# Patient Record
Sex: Male | Born: 1950 | Race: White | Hispanic: No | Marital: Married | Smoking: Never smoker
Health system: Southern US, Community
[De-identification: ages and names within clinical notes are randomized; demographics above are authoritative.]

## PROBLEM LIST (undated history)

## (undated) DIAGNOSIS — I7121 Aneurysm of the ascending aorta, without rupture: Secondary | ICD-10-CM

## (undated) DIAGNOSIS — IMO0002 Reserved for concepts with insufficient information to code with codable children: Secondary | ICD-10-CM

## (undated) DIAGNOSIS — I712 Thoracic aortic aneurysm, without rupture: Secondary | ICD-10-CM

## (undated) DIAGNOSIS — J302 Other seasonal allergic rhinitis: Secondary | ICD-10-CM

## (undated) DIAGNOSIS — C73 Malignant neoplasm of thyroid gland: Secondary | ICD-10-CM

## (undated) DIAGNOSIS — C801 Malignant (primary) neoplasm, unspecified: Secondary | ICD-10-CM

## (undated) DIAGNOSIS — E039 Hypothyroidism, unspecified: Secondary | ICD-10-CM

## (undated) DIAGNOSIS — I10 Essential (primary) hypertension: Secondary | ICD-10-CM

## (undated) DIAGNOSIS — M1990 Unspecified osteoarthritis, unspecified site: Secondary | ICD-10-CM

## (undated) DIAGNOSIS — E785 Hyperlipidemia, unspecified: Secondary | ICD-10-CM

## (undated) DIAGNOSIS — I499 Cardiac arrhythmia, unspecified: Secondary | ICD-10-CM

## (undated) HISTORY — PX: PROSTATE SURGERY: SHX751

## (undated) HISTORY — PX: JOINT REPLACEMENT: SHX530

## (undated) HISTORY — DX: Malignant neoplasm of thyroid gland: C73

## (undated) HISTORY — DX: Thoracic aortic aneurysm, without rupture: I71.2

## (undated) HISTORY — PX: COLONOSCOPY: SHX174

## (undated) HISTORY — PX: KNEE ARTHROSCOPY: SUR90

## (undated) HISTORY — DX: Unspecified osteoarthritis, unspecified site: M19.90

## (undated) HISTORY — DX: Cardiac arrhythmia, unspecified: I49.9

## (undated) HISTORY — DX: Hyperlipidemia, unspecified: E78.5

## (undated) HISTORY — DX: Essential (primary) hypertension: I10

## (undated) HISTORY — DX: Aneurysm of the ascending aorta, without rupture: I71.21

---

## 1971-06-22 HISTORY — PX: PILONIDAL CYST EXCISION: SHX744

## 1995-06-22 HISTORY — PX: HEMORROIDECTOMY: SUR656

## 1996-06-21 HISTORY — PX: SHOULDER ARTHROSCOPY: SHX128

## 2002-06-21 HISTORY — PX: KNEE ARTHROSCOPY: SHX127

## 2003-06-22 HISTORY — PX: ABLATION OF DYSRHYTHMIC FOCUS: SHX254

## 2007-06-22 HISTORY — PX: CARDIOVERSION: SHX1299

## 2008-02-14 ENCOUNTER — Ambulatory Visit: Payer: Self-pay | Admitting: Cardiology

## 2008-02-20 ENCOUNTER — Encounter: Payer: Self-pay | Admitting: Cardiology

## 2008-02-20 ENCOUNTER — Ambulatory Visit: Payer: Self-pay

## 2008-03-07 ENCOUNTER — Ambulatory Visit: Payer: Self-pay | Admitting: Cardiology

## 2008-03-18 ENCOUNTER — Ambulatory Visit: Payer: Self-pay | Admitting: Surgery

## 2008-09-09 ENCOUNTER — Encounter: Admission: RE | Admit: 2008-09-09 | Discharge: 2008-09-09 | Payer: Self-pay | Admitting: Surgery

## 2008-09-12 ENCOUNTER — Ambulatory Visit: Payer: Self-pay | Admitting: Surgery

## 2008-10-30 ENCOUNTER — Encounter: Payer: Self-pay | Admitting: Cardiology

## 2008-11-07 ENCOUNTER — Encounter: Admission: RE | Admit: 2008-11-07 | Discharge: 2008-11-07 | Payer: Self-pay | Admitting: Surgery

## 2008-11-07 ENCOUNTER — Encounter (INDEPENDENT_AMBULATORY_CARE_PROVIDER_SITE_OTHER): Payer: Self-pay | Admitting: Interventional Radiology

## 2008-11-07 ENCOUNTER — Other Ambulatory Visit: Admission: RE | Admit: 2008-11-07 | Discharge: 2008-11-07 | Payer: Self-pay | Admitting: Interventional Radiology

## 2008-12-27 ENCOUNTER — Encounter (INDEPENDENT_AMBULATORY_CARE_PROVIDER_SITE_OTHER): Payer: Self-pay | Admitting: Surgery

## 2008-12-27 ENCOUNTER — Ambulatory Visit (HOSPITAL_COMMUNITY): Admission: RE | Admit: 2008-12-27 | Discharge: 2008-12-28 | Payer: Self-pay | Admitting: Surgery

## 2008-12-27 HISTORY — PX: THYROIDECTOMY: SHX17

## 2009-01-28 ENCOUNTER — Encounter: Payer: Self-pay | Admitting: Cardiology

## 2009-01-31 ENCOUNTER — Ambulatory Visit: Payer: Self-pay | Admitting: Family Medicine

## 2009-01-31 DIAGNOSIS — L259 Unspecified contact dermatitis, unspecified cause: Secondary | ICD-10-CM | POA: Insufficient documentation

## 2009-01-31 DIAGNOSIS — I10 Essential (primary) hypertension: Secondary | ICD-10-CM | POA: Insufficient documentation

## 2009-01-31 DIAGNOSIS — E785 Hyperlipidemia, unspecified: Secondary | ICD-10-CM | POA: Insufficient documentation

## 2009-02-07 ENCOUNTER — Encounter (HOSPITAL_COMMUNITY): Admission: RE | Admit: 2009-02-07 | Discharge: 2009-04-25 | Payer: Self-pay | Admitting: Endocrinology

## 2009-03-25 ENCOUNTER — Encounter: Payer: Self-pay | Admitting: Cardiology

## 2009-06-20 ENCOUNTER — Ambulatory Visit: Payer: Self-pay | Admitting: Family Medicine

## 2009-06-20 DIAGNOSIS — J069 Acute upper respiratory infection, unspecified: Secondary | ICD-10-CM | POA: Insufficient documentation

## 2009-09-01 ENCOUNTER — Encounter: Admission: RE | Admit: 2009-09-01 | Discharge: 2009-09-01 | Payer: Self-pay | Admitting: Surgery

## 2009-09-02 ENCOUNTER — Ambulatory Visit: Payer: Self-pay | Admitting: Surgery

## 2010-02-12 ENCOUNTER — Ambulatory Visit: Payer: Self-pay | Admitting: Diagnostic Radiology

## 2010-02-12 ENCOUNTER — Emergency Department (HOSPITAL_BASED_OUTPATIENT_CLINIC_OR_DEPARTMENT_OTHER): Admission: EM | Admit: 2010-02-12 | Discharge: 2010-02-12 | Payer: Self-pay | Admitting: Emergency Medicine

## 2010-02-12 ENCOUNTER — Encounter: Payer: Self-pay | Admitting: Family Medicine

## 2010-07-12 ENCOUNTER — Encounter: Payer: Self-pay | Admitting: Endocrinology

## 2010-07-13 ENCOUNTER — Encounter: Payer: Self-pay | Admitting: Endocrinology

## 2010-07-23 NOTE — Assessment & Plan Note (Signed)
Summary: CHANGES IN LEFT SIDE OF FACE,NUMBNESS,LOSS OF NORMAL FACIAL M...    Current Allergies: No known allergies  Assessment PATIENT PROCEEDED TO ER  The patient and/or caregiver has been counseled thoroughly with regard to medications prescribed including dosage, schedule, interactions, rationale for use, and possible side effects and they verbalize understanding.  Diagnoses and expected course of recovery discussed and will return if not improved as expected or if the condition worsens. Patient and/or caregiver verbalized understanding.

## 2010-08-13 ENCOUNTER — Other Ambulatory Visit: Payer: Self-pay | Admitting: Surgery

## 2010-08-13 DIAGNOSIS — I712 Thoracic aortic aneurysm, without rupture: Secondary | ICD-10-CM

## 2010-08-13 DIAGNOSIS — I7121 Aneurysm of the ascending aorta, without rupture: Secondary | ICD-10-CM

## 2010-08-17 ENCOUNTER — Other Ambulatory Visit (HOSPITAL_COMMUNITY): Payer: Self-pay | Admitting: Endocrinology

## 2010-08-17 DIAGNOSIS — C73 Malignant neoplasm of thyroid gland: Secondary | ICD-10-CM

## 2010-08-24 ENCOUNTER — Ambulatory Visit (HOSPITAL_COMMUNITY): Payer: Self-pay

## 2010-08-25 ENCOUNTER — Ambulatory Visit (HOSPITAL_COMMUNITY): Payer: Self-pay

## 2010-08-26 ENCOUNTER — Ambulatory Visit (HOSPITAL_COMMUNITY): Payer: Self-pay

## 2010-08-28 ENCOUNTER — Encounter (HOSPITAL_COMMUNITY): Payer: Self-pay

## 2010-08-31 ENCOUNTER — Encounter (HOSPITAL_COMMUNITY)
Admission: RE | Admit: 2010-08-31 | Discharge: 2010-08-31 | Disposition: A | Discharge status: Home / Self Care | Payer: 59 | Source: Ambulatory Visit | Attending: Endocrinology | Admitting: Endocrinology

## 2010-08-31 DIAGNOSIS — C73 Malignant neoplasm of thyroid gland: Secondary | ICD-10-CM

## 2010-09-01 ENCOUNTER — Ambulatory Visit (HOSPITAL_COMMUNITY)
Admission: RE | Admit: 2010-09-01 | Discharge: 2010-09-01 | Disposition: A | Discharge status: Home / Self Care | Payer: 59 | Source: Ambulatory Visit | Attending: Endocrinology | Admitting: Endocrinology

## 2010-09-02 ENCOUNTER — Ambulatory Visit (HOSPITAL_COMMUNITY)
Admission: RE | Admit: 2010-09-02 | Discharge: 2010-09-02 | Disposition: A | Discharge status: Home / Self Care | Payer: 59 | Source: Ambulatory Visit | Attending: Endocrinology | Admitting: Endocrinology

## 2010-09-04 ENCOUNTER — Encounter (HOSPITAL_COMMUNITY): Payer: Self-pay

## 2010-09-04 ENCOUNTER — Ambulatory Visit (HOSPITAL_COMMUNITY)
Admission: RE | Admit: 2010-09-04 | Discharge: 2010-09-04 | Disposition: A | Discharge status: Home / Self Care | Payer: 59 | Source: Ambulatory Visit | Attending: Endocrinology | Admitting: Endocrinology

## 2010-09-04 DIAGNOSIS — C73 Malignant neoplasm of thyroid gland: Secondary | ICD-10-CM | POA: Insufficient documentation

## 2010-09-04 HISTORY — DX: Malignant (primary) neoplasm, unspecified: C80.1

## 2010-09-04 MED ORDER — SODIUM IODIDE I 131 CAPSULE: 4.1000 | Freq: Once | INTRAVENOUS | Status: AC | PRN

## 2010-09-08 ENCOUNTER — Encounter: Payer: Self-pay | Admitting: Surgery

## 2010-09-08 ENCOUNTER — Other Ambulatory Visit: Payer: Self-pay

## 2010-09-15 ENCOUNTER — Ambulatory Visit
Admission: RE | Admit: 2010-09-15 | Discharge: 2010-09-15 | Disposition: A | Discharge status: Home / Self Care | Payer: 59 | Source: Ambulatory Visit | Attending: Surgery | Admitting: Surgery

## 2010-09-15 ENCOUNTER — Encounter (INDEPENDENT_AMBULATORY_CARE_PROVIDER_SITE_OTHER): Payer: 59 | Admitting: Surgery

## 2010-09-15 DIAGNOSIS — I712 Thoracic aortic aneurysm, without rupture, unspecified: Secondary | ICD-10-CM

## 2010-09-15 DIAGNOSIS — I7121 Aneurysm of the ascending aorta, without rupture: Secondary | ICD-10-CM

## 2010-09-15 MED ORDER — GADOBENATE DIMEGLUMINE 529 MG/ML IV SOLN
15.0000 mL | Freq: Once | INTRAVENOUS | Status: AC | PRN
  Administered 2010-09-15: 15 mL via INTRAVENOUS

## 2010-09-15 NOTE — Assessment & Plan Note (Signed)
OFFICE VISIT  XAIVER, [name] Ayala DOB:  May 24, 1951                                        September 15, 2010 CHART #:  [id number]  The patient returned to my office today for followup of an ascending aortic aneurysm.  I last saw him on September 02, 2009, at which time, MR angiogram showed a stable 4.7-cm fusiform ascending aortic aneurysm.  He has been feeling well over the past year and said he has remained physically active without chest pain or shortness of breath.  On physical examination today his blood pressure is 161/82, pulse is 90 and regular, respiratory rate is 16 and unlabored.  Oxygen saturation on room air is 95%.  He looks well.  Cardiac exam shows regular rate and rhythm with normal heart sounds.  There is no murmur, rub, or gallop. His lungs are clear.  MR angiogram of the chest done today shows a stable 4.7-cm fusiform ascending aortic aneurysm.  This is not changed since a scan 1 year ago on September 01, 2009.  The distal ascending aorta tapers down to about 3.7 cm.  The distal arch is 3 cm.  There is no sign of aortic dissection.  IMPRESSION:  The patient has a stable 4.7-cm fusiform ascending aortic aneurysm with no evidence of aortic insufficiency on exam.  I recommended continued followup with a repeat MR angiogram to 1 year.  I stressed the importance of good blood pressure control.  He said that his blood pressure usually runs in the 120s at home and he checks it frequently, but thinks that his blood pressure probably increases when he comes to the doctor's office.  His systolic blood pressure was in the 160 range today and was 162/84, when I saw him last year.  He will continue to follow up with his primary physician, Dr. Olivia Canter, as well as his cardiologist, Dr. Valera Castle.  He has been followed by Dr. Darnell Level and Dr. 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  Evelene Croon, M.D. Electronically Signed  BB/MEDQ  D:  09/15/2010  T:  09/15/2010  Job:  [id number]  cc:   Dr. Anise Salvo. Daleen Squibb, MD, [facility]. Evlyn Kanner, M.D.

## 2010-09-27 LAB — URINALYSIS, ROUTINE W REFLEX MICROSCOPIC
Hgb urine dipstick: NEGATIVE
Ketones, ur: NEGATIVE mg/dL
Nitrite: NEGATIVE
Protein, ur: NEGATIVE mg/dL
Specific Gravity, Urine: 1.024 (ref 1.005–1.030)
Urobilinogen, UA: 0.2 mg/dL (ref 0.0–1.0)

## 2010-09-27 LAB — COMPREHENSIVE METABOLIC PANEL
ALT: 30 U/L (ref 0–53)
Albumin: 4.4 g/dL (ref 3.5–5.2)
BUN: 26 mg/dL — ABNORMAL HIGH (ref 6–23)
Calcium: 9.5 mg/dL (ref 8.4–10.5)
Chloride: 105 mEq/L (ref 96–112)
Creatinine, Ser: 0.99 mg/dL (ref 0.4–1.5)

## 2010-09-27 LAB — DIFFERENTIAL
Basophils Absolute: 0 10*3/uL (ref 0.0–0.1)
Basophils Relative: 1 % (ref 0–1)
Eosinophils Absolute: 0.1 10*3/uL (ref 0.0–0.7)
Lymphocytes Relative: 31 % (ref 12–46)
Lymphs Abs: 1.1 10*3/uL (ref 0.7–4.0)
Monocytes Relative: 10 % (ref 3–12)

## 2010-09-27 LAB — CALCIUM: Calcium: 9.2 mg/dL (ref 8.4–10.5)

## 2010-09-27 LAB — PROTIME-INR
INR: 0.9 (ref 0.00–1.49)
Prothrombin Time: 12.1 seconds (ref 11.6–15.2)

## 2010-09-27 LAB — CBC
HCT: 46.3 % (ref 39.0–52.0)
RBC: 4.93 MIL/uL (ref 4.22–5.81)
RDW: 12.6 % (ref 11.5–15.5)
WBC: 3.6 10*3/uL — ABNORMAL LOW (ref 4.0–10.5)

## 2010-11-03 NOTE — Assessment & Plan Note (Signed)
OFFICE VISIT   [name] Ayala, [name] Ayala  DOB:  [birth year]                                        September 12, 2008  CHART #:  [id number]   The patient returns today for followup of an ascending aortic aneurysm.  I last saw him on March 18, 2008, at which time CT scan dated  March 07, 2008, showed an ascending aortic aneurysm measuring 4.8 cm  in the mid ascending aorta.  This tapered to a diameter about 4 cm in  the distal ascending aorta and the distal arch measured about 3 cm.  The  descending thoracic aorta was 2.8 cm at the level of the diaphragm.  Over the past year, the patient underwent cardioversion for atrial  fibrillation or atrial flutter.  He said he is maintaining sinus rhythm  on Cardizem.  He has had no chest pain or pressure.  He denies any  shortness of breath.  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   PHYSICAL EXAMINATION:  VITAL SIGNS:  Today his blood pressure is  elevated at 175/93, his pulse is 75 and regular.  Respiratory rate is  18, unlabored.  Oxygen saturation on room air is 97%.  GENERAL:  He looks well.  CARDIAC:  Regular rate and rhythm with normal heart sounds.  There is no  murmur.  LUNGS:  Clear.  EXTREMITIES:  No peripheral edema.   MR angiogram of the chest today shows no change in the size of his  ascending aortic aneurysm at 4.8 cm in the mid ascending aorta.  In the  MR angiogram report, the radiologist also commented on a right thyroid  nodule, which they felt showed up better on the CT scan in September  2009.  In that CT scan, they measured it at 2.7 cm, although they did  not comment on that nodule in that original CT scan report.   His medications are Cardizem 360 mg daily, Crestor 10 mg daily, fish oil  1000 mg daily, vitamin C 1000 mg daily, and aspirin 81 mg daily.   IMPRESSION:  The patient has a 4.8-cm ascending aortic aneurysm, which  is unchanged from a CT scan on  March 07, 2008.  I have recommended  continuing to follow this aneurysm with a repeat MR angiogram in 1 year.  I have stressed the importance of good blood pressure control with him  and he said that his blood pressure is usually running much less than it  is today and is usually in the 130 systolic.  He said that he was  somewhat nervous coming to the office today.  I have recommended that he  obtain his own blood pressure cuff to keep a close eye on his blood  pressure and avoid heavy weight lifting, which may suddenly increase his  blood pressure.  He also has a right thyroid nodule that was seen on CT  scan in September, but not commented on at that time.  We will refer him  to General Surgery for further evaluation of this nodule.  He will  continue to follow up with Dr. Daleen Squibb for his Cardiology care and Dr.  Sampson Goon for his electrophysiology.   Evelene Croon, M.D.  Electronically Signed   BB/MEDQ  D:  09/12/2008  T:  09/13/2008  Job:  [id number]  cc:   Jesse Sans. Daleen Squibb, MD, [facility]  Olivia Canter, MD  Dr. Sampson Goon

## 2010-11-03 NOTE — Assessment & Plan Note (Signed)
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   The last time I saw him was in 2005.   He is presently 60 years of age and in incredibly good athletic shape.  Since Friday, he has felt lightheaded, dizzy, and short of breath.  He  thinks he is back in a fast rhythm.   His past medical history includes,  1. History of tachypalpitations.  He has had a previous ablation for      SVT by Dr. Sampson Goon.  2. Hyperlipidemia, followed by Dr. Olivia Canter in [place].  3. Mild aortic root dilatation.  4. Mild aortic valve sclerosis.  5. Mitral valve prolapse with mild mitral regurgitation by last 2-D      echocardiogram in 2005.   He is currently taking;  1. Crestor 10 mg a day.  2. Enteric-coated aspirin 81 mg a day.   He has no known drug allergies.  He has been on diltiazem in the past.   He has no dye allergies.  He has no drug allergies.  He does not smoke  or drink, but he does have 1-2 alcoholic beverages per week.  He has 2  glasses of ice tea a day.  He works on a regular basis.   SURGICAL HISTORY:  He has had both knees operated on in 1972, 1982, and  then again in 2003.  He has had a right rotator cuff repair in 1998.  He  had the ablation in 2003 with Dr. Sampson Goon.   FAMILY HISTORY:  Negative other than for high blood pressure.   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  He enjoys Brewing technologist games.  He works out a lot.  He is married and has 2 children.  He lives in  [place].   REVIEW OF SYSTEMS:  Other than in the HPI is negative.  He denies  orthopnea, PND, syncope, or presyncope.   PHYSICAL EXAMINATION:  He is in a remarkably good  shape, very pleasant,  in no acute distress.  Blood pressure 130/90 and his heart rate is 117  on his EKG, but what it looks like on the surface ECG is atrial flutter.  During his exam, it varied from 60-150.  HEENT, normocephalic and  atraumatic.  PERRLA.  Extraocular movements are intact.  Sclerae are  clear.  Facial symmetry is normal.  Carotid upstrokes are equal  bilaterally without bruits.  No JVD.  Thyroid is not enlarged.  Trachea  is midline.  Lungs are clear.  Heart reveals a rapid rate and rhythm  with some irregularity.  Abdominal exam is soft, good bowel sounds, no  midline bruit, and no hepatomegaly.  Extremities with no cyanosis,  clubbing, or edema.  Pulses are intact.  Neuro exam is  intact.   ASSESSMENT:  1. Recurrent supraventricular tachycardia, probably atrial flutter,      with a variable rate and block.  2. Shortness of breath, dizziness, and reduced exercise tolerance      secondary to recurrent supraventricular tachycardia, probably      atrial flutter, with a variable rate and block.   RECOMMENDATIONS:  I had a long talk with [name] Ayala.  He has an  appointment with Dr. Sampson Goon in a month, and that is too long.  I  will send a copy of this note and an EKG, and he will call Dr.  Jarrett Ables office to be seen hopefully by next week.  I placed him on  low-dose diltiazem XR 180 mg a day.     [name] C. Daleen Squibb, MD, [facility]  Electronically Signed    TCW/MedQ  DD: 02/14/2008  DT: 02/15/2008  Job #: [id number]   cc:   Clydie Braun, MD

## 2010-11-03 NOTE — Consult Note (Signed)
NEW PATIENT CONSULTATION   [name] Ayala, [name] Ayala  DOB:  1951-05-26                                        March 18, 2008  CHART #:  [id number]   REASON FOR CONSULTATION:  Ascending aortic aneurysm.   CLINICAL HISTORY:  I was asked by Dr. Daleen Squibb to evaluate the patient for  ascending aortic aneurysm.  He is a 60 year old muscular and very  athletic gentleman with a history of hypertension and hyperlipidemia,  who has a history of atrial arrhythmias, having undergone ablation for  supraventricular tachycardia in the past by Dr. Sampson Goon at [facility].  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  He was seen by Dr. Daleen Squibb and felt to be in  atrial flutter by electrocardiogram.  His heart rate was 117.  He was  started on diltiazem XR 180 mg daily.  He underwent an echocardiogram on  February 20, 2008, which showed overall normal left ventricular systolic  function and normal left ventricular size with ejection fraction of 60%.  There is mild aortic valve sclerosis.  The aortic root was at the upper  limits of normal size with a diameter of 38 mm.  The ascending aorta was  enlarged at 46 mm.  There is mild thickening in the mitral valve with no  mitral valve prolapse and trivial regurgitation.  The patient was  subsequently scheduled for a CT angiogram of the chest performed on  March 07, 2008, which showed an ascending aortic aneurysm measuring  4.8 cm in the mid ascending aorta.  This tapered to a diameter of 4 cm  in the distal ascending aorta and the distal arch measured about 3 cm in  diameter.  The descending thoracic aorta measures about 2.8 cm at the  level of diaphragm.   REVIEW OF SYSTEMS:  His review of systems is as follows:  General:  He  denies any fever or chills.  He has lost some weight over the past year  which he attributes to trying  to cut down on his weightlifting.  He had  fatigue associated with his tachycardia.  Eyes:  Negative.  ENT:  Negative.  Endocrine:  Denies diabetes and hypothyroidism.  Cardiovascular:  He denies any chest pain or pressure.  He did have some  shortness of breath with exertion when these tachyarrhythmias started.  He denies PND or orthopnea.  He has had no peripheral edema.  Respiratory:  Denies cough and sputum production.  GI:  He has had no  nausea or vomiting.  Denies melena and bright red blood per rectum.  GU:  He denies dysuria and hematuria.  Vascular:  Denies claudication or  phlebitis.  Neurological:  He has no focal weakness or numbness.  Denies  dizziness and syncope.  He has never had TIA or stroke.  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  Psychiatric:  Negative.  Hematologic:  Negative.   PAST MEDICAL HISTORY:  His past medical history significant for  hypertension.  He has been reluctant to take antihypertensive in the  past.  He has a history of hyperlipidemia.  He has history of tachy-  palpitations and supraventricular tachycardia, as mentioned above  followed by Dr. Sampson Goon at  [facility].  He is  status post ablation for this.  He is status post surgery on both knees  and status post right rotator cuff repair in the past.   FAMILY HISTORY:  Negative for cardiovascular and aneurysmal disease.   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  He has two grown  children.  He has never smoked and denies regular alcohol use.   ALLERGIES:  None.   MEDICATIONS:  Crestor 10 mg daily, enteric-coated aspirin 81 mg daily,  Coumadin daily, Cardizem 180 mg two daily, fish oil 1000 mg daily,  vitamin C 1000 mg daily.   PHYSICAL EXAMINATION:  VITAL SIGNS:  Blood pressure is 149/91.  His  pulse is 120 and regular.  Respiratory rate is 18, unlabored.  Oxygen  saturation is 97%.  GENERAL:  He is a  well-developed, muscular white male in no distress.  HEENT:  Normocephalic and atraumatic.  Pupils are equal and reactive to  light and accommodation.  Extraocular muscles are intact.  His throat is  clear.  NECK:  Normal carotid pulses bilaterally.  There are no bruits.  There  is no adenopathy or thyromegaly.  CARDIAC:  Regular rate and rhythm with tachycardia present.  There is no  murmur, rub, or gallop.  LUNGS:  Clear.  ABDOMEN:  Active bowel sounds.  Abdomen is soft and nontender.  There  are no palpable masses or organomegaly.  EXTREMITIES:  No peripheral edema.  There are scars over both knees.  Pedal pulses are palpable bilaterally.  SKIN:  Warm and dry.  NEUROLOGIC:  Alert and oriented x3.  Motor and sensory exam is grossly  normal.   IMPRESSION:  The patient has a 4.8 cm ascending aortic aneurysm of  unclear duration.  The size of his descending thoracic aorta is about  2.8 to 3 cm.  He has no significant aortic valve disease by  echocardiogram.  I discussed the indications for surgical repair of his  aortic aneurysm with him.  I have told him that in general I would  recommend operative repair if he initially presented with an aneurysm of  5.5 cm or if we see a progressive enlargement of his aneurysm on  successive CT scans.  At this time, I would recommend doing a MR  angiogram in 6 months to reassess this for any enlargement.  I have also  stressed the importance of good blood pressure control.  I think he  should be on a beta-blocker and possibly an ACE inhibitor as tolerated.  I will leave that decision up to Dr. Daleen Squibb since he is currently on  Cardizem for control of his atrial arrhythmias.  He is currently on  Coumadin for his atrial flutter and is going to follow up with Dr.  Sampson Goon for consideration of electrophysiology study and possible  ablation.  I have also discussed the importance of avoiding extremely  strenuous activities such as heavy weight lifting  which may cause high  blood pressure spikes that could increase his risk for aortic dissection  or rupture.  I think the risk of these complications under baseline  conditions at the current size of the aorta is very low, but he said  that he is frequently lifting heavy weights which would certainly  increase his risk.  He seems to understand this and said that he is  planning on trying to decrease his weightlifting to avoid that type of  strenuous exertion.  I will  plan to see him back in 6 months with an MR  angiogram to reassess the aneurysm.   Evelene Croon, M.D.  Electronically Signed   BB/MEDQ  D:  03/18/2008  T:  03/19/2008  Job:  [id number]   cc:   Thomas C. Daleen Squibb, MD, [facility]  Mick Sell, MD

## 2010-11-03 NOTE — Assessment & Plan Note (Signed)
OFFICE VISIT   [name] Ayala, [name] Ayala  DOB:  [birth year]                                        September 02, 2009  CHART #:  [id number]   HISTORY:  The patient returned today for followup of his ascending  aortic aneurysm.  I last saw him on September 12, 2008, at which time his  4.8-cm ascending aortic aneurysm was unchanged from previous CT scan in  September 2009.  The scan did show a right thyroid nodule and he was  referred to General Surgery.  Dr. 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  He subsequently  underwent radioactive iodine treatment and has subsequently been  followed up by Dr. Gerrit Friends and Dr. Adrian Prince and is doing well from  that.  The patient denies any chest pressure or pain.  He has had no  back pain.  He denies any shortness of breath.  He has been remaining  quite active, but is staying away from weightlifting.   PHYSICAL EXAMINATION:  Today, his blood pressure 162/84, pulse is 72 and  regular, respiratory rate is 18 and unlabored.  Oxygen saturation on  room air is 98%.  He looks well.  Cardiac exam shows regular rate and  rhythm with normal heart sounds.  There is no murmur.  His lungs are  clear.   MR angiogram of the chest done on September 01, 2009 shows a stable 4.7-cm  ascending aortic aneurysm compared to previous scan of September 09, 2008.  The distal ascending aorta measures 3.7 cm and the distal arch 3 cm.  There is no evidence of aortic dissection.   MEDICATIONS:  1. Diltiazem 360 mg daily.  2. Crestor 10 mg daily.  3. Aspirin 81 mg daily.  4. Losartan 25 mg daily.  5. Fish oil 1000 mg daily.  6. Vitamin C 1000 mg daily.   IMPRESSION:  The patient has a stable 4.7-cm ascending aortic aneurysm.  I would recommend continued followup.  This has been stable over the  past 2 years.  We will plan to do a MR angiogram in 1  year.  I stressed  the importance of good blood pressure control.  He said that he checks  his blood pressure frequently at home and it is usually in the 120  range.  I will plan to see him back in 1 year.   Evelene Croon, M.D.  Electronically Signed   BB/MEDQ  D:  09/02/2009  T:  09/03/2009  Job:  [id number]   cc:   Thomas C. Daleen Squibb, MD, [facility]  Dr. Olivia Canter  Velora Heckler, MD  Tera Mater. Evlyn Kanner, M.D.

## 2010-11-03 NOTE — Op Note (Signed)
NAME:  [name] Ayala, [name] Ayala NO.:  [id number]   MEDICAL RECORD NO.:  [id number]          PATIENT TYPE:  OIB   LOCATION:  1523                         FACILITY:  [facility]   PHYSICIAN:  Velora Heckler, MD      DATE OF BIRTH:  March 27, 1951   DATE OF PROCEDURE:  12/27/2008  DATE OF DISCHARGE:                               OPERATIVE REPORT   PREOPERATIVE DIAGNOSIS:  Right thyroid nodule with cytologic atypia.   POSTOPERATIVE DIAGNOSIS:  Right thyroid nodule with cytologic atypia.   PROCEDURE:  Total thyroidectomy.   SURGEON:  Velora Heckler, M.D., FACS   ANESTHESIA:  General per Dr. Brayton Caves.   ESTIMATED BLOOD LOSS:  Minimal.   PREPARATION:  ChloraPrep.   COMPLICATIONS:  None.   INDICATIONS:  The patient is a 60 year old white male from [place],  [place].  The patient had an incidental finding of a thyroid  nodule found on CT scan of the chest.  He was referred and underwent  fine-needle aspiration and biopsy.  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  Decision was made to proceed  with excision.   BODY OF REPORT:  Procedure was done in OR #11 at the [facility].  The patient was brought to the operating room and  placed in supine position on the operating room table.  Following  administration of general anesthesia, the patient was positioned and  then prepped and draped in the usual strict aseptic fashion.  After  ascertaining that an adequate level of anesthesia had been achieved, a  Kocher incision was made a #15 blade.  Dissection was carried through  subcutaneous tissues and platysma.  Hemostasis was obtained with  electrocautery.  Skin flaps were elevated cephalad and caudad from the  thyroid notch to the sternal notch.  A Mahorner self-retaining retractor  was placed for exposure.  Strap muscles were incised in the midline.  Dissection was begun on the right side.  Strap muscles were  reflected to  the right demonstrating the enlarged right thyroid lobe.  Essentially,  the entire lobe was replaced by a large mass measuring at least 3 to 3.5-  cm in dimension.  This was gently dissected out.  Venous tributaries  were divided between medium hemoclips with the Harmonic scalpel.  Gland  was mobilized anteriorly.  Superior pole vessels were divided between  medium Ligaclips with the Harmonic scalpel.  Parathyroid tissue was  identified and preserved.  Gland was rolled further anteriorly, and the  branches of the inferior thyroid artery were divided between small  Ligaclips.  Recurrent laryngeal nerve was identified and preserved along  its course.  Ligament of Allyson Sabal was transected with electrocautery, and  the gland was mobilized up and onto the anterior trachea.  There was no  significant pyramidal lobe.  Isthmus was mobilized across the midline  with electrocautery.  Dry pack was placed in the right neck.   Next, we turned our attention to the left thyroid lobe.  Again, strap  muscles were reflected laterally.  Left lobe was  small but does contain  at least two 1-cm firm discrete nodules.  Therefore, we proceeded with  total thyroidectomy.  The middle thyroid vein was divided between medium  Ligaclips with the Harmonic scalpel.  Inferior venous tributaries were  divided between medium Ligaclips with the Harmonic scalpel.  Superior  pole vessels were divided between medium Ligaclips with the Harmonic  scalpel.  Gland was rolled anteriorly.  Parathyroid tissue was  identified and preserved on its vascular pedicle.  Branches of the  inferior thyroid artery were divided between small Ligaclips.  Recurrent  laryngeal nerve was identified and preserved.  Ligament of Allyson Sabal was  transected with electrocautery, and the gland was excised off of the  anterior trachea using the Harmonic scalpel.  Specimen was submitted to  pathology with a suture marking the right superior pole.   Neck was  irrigated with warm saline.  Good hemostasis was noted.  Surgicel was  placed in the operative field bilaterally.  Strap muscles were  reapproximated in the midline with interrupted 3-0 Vicryl sutures.  Platysma was closed with interrupted 3-0 Vicryl sutures.  Skin was  closed with a running 4-0 Monocryl subcuticular suture.  Wound was  washed and dried, and Benzoin and Steri-Strips were applied.  Sterile  dressings were applied.  The patient was awakened from anesthesia and  brought to the recovery room in stable condition.  The patient tolerated  the procedure well.      Velora Heckler, MD  Electronically Signed     TMG/MEDQ  D:  12/27/2008  T:  12/27/2008  Job:  [id number]   cc:   Velora Heckler, MD  [address]  [place]  [place] [address]   Evelene Croon, M.D.  [address] [place] [address]  [place]   Olivia Canter, M.D.   Thomas C. Wall, MD, FACC  [address]  [place]  [place] [address]

## 2012-05-04 ENCOUNTER — Other Ambulatory Visit: Payer: Self-pay | Admitting: Surgery

## 2012-05-04 DIAGNOSIS — I712 Thoracic aortic aneurysm, without rupture, unspecified: Secondary | ICD-10-CM

## 2012-05-08 ENCOUNTER — Other Ambulatory Visit (HOSPITAL_COMMUNITY): Payer: Self-pay | Admitting: Endocrinology

## 2012-05-08 DIAGNOSIS — C73 Malignant neoplasm of thyroid gland: Secondary | ICD-10-CM

## 2012-05-30 ENCOUNTER — Ambulatory Visit (INDEPENDENT_AMBULATORY_CARE_PROVIDER_SITE_OTHER): Payer: 59 | Admitting: Surgery

## 2012-05-30 ENCOUNTER — Ambulatory Visit
Admission: RE | Admit: 2012-05-30 | Discharge: 2012-05-30 | Disposition: A | Discharge status: Home / Self Care | Payer: 59 | Source: Ambulatory Visit | Attending: Surgery | Admitting: Surgery

## 2012-05-30 ENCOUNTER — Encounter: Payer: Self-pay | Admitting: Surgery

## 2012-05-30 VITALS — BP 160/88 | HR 76 | Resp 18 | Ht 73.0 in | Wt 225.0 lb

## 2012-05-30 DIAGNOSIS — I712 Thoracic aortic aneurysm, without rupture, unspecified: Secondary | ICD-10-CM

## 2012-05-30 MED ORDER — GADOBENATE DIMEGLUMINE 529 MG/ML IV SOLN
20.0000 mL | Freq: Once | INTRAVENOUS | Status: AC | PRN
  Administered 2012-05-30: 20 mL via INTRAVENOUS

## 2012-05-30 NOTE — Progress Notes (Signed)
[address]            [place] [address]          [phone]      HPI:  He returns today for followup of a fusiform ascending aortic aneurysm. I last saw him in March of 2012 at which time MR angiogram showed the maximum diameter of the ascending aorta to be 4.7 cm. He reports that he has been feeling well without chest pain or shortness of breath. He remains physically very active but has been avoiding heavy weight lifting.  Current Outpatient Prescriptions  Medication Sig Dispense Refill  . aspirin 81 MG tablet Take 81 mg by mouth daily.      Marland Kitchen atorvastatin (LIPITOR) 40 MG tablet Take 40 mg by mouth daily.       Marland Kitchen diltiazem (CARDIZEM CD) 180 MG 24 hr capsule Take 180 mg by mouth daily.       . flecainide (TAMBOCOR) 50 MG tablet Take 50 mg by mouth 2 (two) times daily.       Marland Kitchen SYNTHROID 200 MCG tablet Take 200 mcg by mouth daily.        No current facility-administered medications for this visit.   Facility-Administered Medications Ordered in Other Visits  Medication Dose Route Frequency Provider Last Rate Last Dose  . [COMPLETED] gadobenate dimeglumine (MULTIHANCE) injection 20 mL  20 mL Intravenous Once PRN Medication Radiologist, MD   20 mL at 05/30/12 1159     Physical Exam: BP 160/88  Pulse 76  Resp 18  Ht 6\' 1"  (1.854 m)  Wt 225 lb (102.059 kg)  BMI 29.69 kg/m2  SpO2 98% He looks well. Cardiac exam shows a regular rate and rhythm with normal heart sounds. There is no murmur. Lung exam is clear.  Diagnostic Tests:  *RADIOLOGY REPORT*   Clinical Data:  Aortic aneurysm   MRA CHEST WITHOUT AND WITH CONTRAST   Technique:  Angiographic images of the chest were obtained using MRA technique without and with intravenous contrast.   BUN and creatinine were obtained on site at [facility] at [address] Results:  BUN 12 mg/dL,  Creatinine 1.1 mg/dL.   Contrast: 20mL MULTIHANCE GADOBENATE DIMEGLUMINE 529 MG/ML IV  SOLN   Comparison:  09/15/2010   Findings:  Aneurysmal dilatation of the ascending aorta is stable. Maximal diameter at the sinus of Valsalva, sinotubular junction, and ascending aorta are 4.7, 4.3, and 4.7 cm.  Takeoff of the innominate artery, left common carotid artery, and left subclavian artery are widely patent.  Right common carotid and subclavian arteries are widely patent.  Bilateral vertebral arteries are patent.  Pulmonary arteries are grossly within normal limits. There is patency of the bilateral innominate veins and SVC.   Normal heart size.  No evidence of mediastinal mass effect.  Lungs are grossly clear.   IMPRESSION: Stable aneurysmal dilatation of the ascending aorta with maximal diameter of 4.7 cm.     Original Report Authenticated By: Jolaine Click, M.D.     Impression:  He has a stable 4.7 cm fusiform ascending aortic aneurysm that has not changed since it was initially diagnosed in 2009. He said that he has had good blood pressure control at home with a systolic blood pressure around 120. He feels that his blood pressure is elevated today due to the stress of his appointment. I recommended that we  repeat his MR angiogram in 2 years to followup on this aneurysm.  Plan:  I will plan to see him back in about 2 years with an MR angiogram of the chest.

## 2012-06-05 ENCOUNTER — Encounter (HOSPITAL_COMMUNITY)
Admission: RE | Admit: 2012-06-05 | Discharge: 2012-06-05 | Disposition: A | Discharge status: Home / Self Care | Payer: 59 | Source: Ambulatory Visit | Attending: Endocrinology | Admitting: Endocrinology

## 2012-06-05 DIAGNOSIS — C73 Malignant neoplasm of thyroid gland: Secondary | ICD-10-CM | POA: Insufficient documentation

## 2012-06-05 MED ORDER — THYROTROPIN ALFA 1.1 MG IM SOLR
0.9000 mg | INTRAMUSCULAR | Status: AC
  Administered 2012-06-05: 0.9 mg via INTRAMUSCULAR
  Filled 2012-06-05: qty 0.9

## 2012-06-06 ENCOUNTER — Encounter (HOSPITAL_COMMUNITY)
Admission: RE | Admit: 2012-06-06 | Discharge: 2012-06-06 | Disposition: A | Discharge status: Home / Self Care | Payer: 59 | Source: Ambulatory Visit | Attending: Endocrinology | Admitting: Endocrinology

## 2012-06-06 DIAGNOSIS — C73 Malignant neoplasm of thyroid gland: Secondary | ICD-10-CM | POA: Insufficient documentation

## 2012-06-06 MED ORDER — THYROTROPIN ALFA 1.1 MG IM SOLR
0.9000 mg | INTRAMUSCULAR | Status: AC
  Administered 2012-06-06: 0.9 mg via INTRAMUSCULAR
  Filled 2012-06-06: qty 0.9

## 2012-06-07 ENCOUNTER — Encounter (HOSPITAL_COMMUNITY)
Admission: RE | Admit: 2012-06-07 | Discharge: 2012-06-07 | Disposition: A | Discharge status: Home / Self Care | Payer: 59 | Source: Ambulatory Visit | Attending: Endocrinology | Admitting: Endocrinology

## 2012-06-07 DIAGNOSIS — C73 Malignant neoplasm of thyroid gland: Secondary | ICD-10-CM | POA: Insufficient documentation

## 2012-06-09 ENCOUNTER — Encounter (HOSPITAL_COMMUNITY)
Admission: RE | Admit: 2012-06-09 | Discharge: 2012-06-09 | Disposition: A | Discharge status: Home / Self Care | Payer: 59 | Source: Ambulatory Visit | Attending: Endocrinology | Admitting: Endocrinology

## 2012-06-09 ENCOUNTER — Encounter (HOSPITAL_COMMUNITY): Payer: Self-pay

## 2012-06-09 DIAGNOSIS — C73 Malignant neoplasm of thyroid gland: Secondary | ICD-10-CM | POA: Insufficient documentation

## 2012-06-09 MED ORDER — SODIUM IODIDE I 131 CAPSULE: 3.7000 | Freq: Once | INTRAVENOUS | Status: AC | PRN

## 2013-07-11 ENCOUNTER — Other Ambulatory Visit: Payer: Self-pay | Admitting: Orthopedic Surgery

## 2013-07-12 ENCOUNTER — Encounter (HOSPITAL_BASED_OUTPATIENT_CLINIC_OR_DEPARTMENT_OTHER): Payer: Self-pay | Admitting: *Deleted

## 2013-07-13 NOTE — Progress Notes (Signed)
Talked with pt-this was to be local-called marsha-yes-chg to local pt to be here 7am

## 2013-07-16 NOTE — H&P (Signed)
  [name] Ayala presents for evaluation of a mass on the dorsal radial aspect of his left index finger proximal phalangeal segment. 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 He has had a history of thyroid cancer that was treated with surgery and radioactive iodine therapy. One month ago he noted a mildly uncomfortable bump on the dorsal radial aspect of his left index finger proximal phalangeal segment. This has not changed in size. He was concerned with his history of thyroid cancer and seeks an upper extremity orthopaedic consult.  His past history is reviewed in detail. He reports that he is 6'1" and 225 lbs. He is very active working on cars at home. He has no drug allergies. Current medications include atorvastatin 40 mg daily, Synthroid  200 micrograms daily, Diltiazem 180 mg b.i.d. Flecinide 50 mg b.i.d. vitamin C 500 mg daily, aspirin 81 mg daily. Prior surgery includes knee surgery, shoulder surgery, and thyroidectomy in 2009. He has a history of atrial flutter treated successfully with cardioversion. His social history reveals he is married. He is a nonsmoker. He enjoys an occasional glass of wine. His family history is detailed and positive for primary relatives with hypertension and osteoarthritis. A 14 system review of systems reveals corrective lenses and history of high BP.  Physical exam reveals a well appearing 63 year old gentleman. Inspection of his hands reveals an obvious lump on the dorsal radial aspect of his left index finger proximal phalanx. This measures 12 mm in length by 6 mm in width. It is mildly firm. It does not move with tendon motion and flexion/extension of the finger. This appears to be periosteal. His neurovascular exam is intact.   X-rays of his finger 4 views demonstrate soft tissue swelling and obvious bony abnormalities. There is an "assembly line" on the radial aspect of his proximal phalanx at the insertion of his A-2 pulley.  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  I advised [name] Ayala to consider observing this for another month to see if it is inflammatory. With his history of thyroid cancer he is very eager to proceed with biopsy for reassurance. We will schedule a biopsy of his mass under straight local anesthesia at a mutually convenient time. The surgery, after care, risks and benefits were described in detail. We will schedule this at the [facility] at a mutually convenient time in the near future.

## 2013-07-17 ENCOUNTER — Encounter (HOSPITAL_BASED_OUTPATIENT_CLINIC_OR_DEPARTMENT_OTHER): Payer: Self-pay

## 2013-07-17 ENCOUNTER — Encounter (HOSPITAL_BASED_OUTPATIENT_CLINIC_OR_DEPARTMENT_OTHER)
Admission: RE | Disposition: A | Discharge status: Home / Self Care | Payer: Self-pay | Source: Ambulatory Visit | Attending: Orthopedic Surgery

## 2013-07-17 ENCOUNTER — Ambulatory Visit (HOSPITAL_BASED_OUTPATIENT_CLINIC_OR_DEPARTMENT_OTHER)
Admission: RE | Admit: 2013-07-17 | Discharge: 2013-07-17 | Disposition: A | Discharge status: Home / Self Care | Payer: BC Managed Care – PPO | Source: Ambulatory Visit | Attending: Orthopedic Surgery | Admitting: Orthopedic Surgery

## 2013-07-17 DIAGNOSIS — Z85850 Personal history of malignant neoplasm of thyroid: Secondary | ICD-10-CM | POA: Insufficient documentation

## 2013-07-17 DIAGNOSIS — M7130 Other bursal cyst, unspecified site: Secondary | ICD-10-CM | POA: Insufficient documentation

## 2013-07-17 HISTORY — DX: Reserved for concepts with insufficient information to code with codable children: IMO0002

## 2013-07-17 HISTORY — DX: Hypothyroidism, unspecified: E03.9

## 2013-07-17 HISTORY — DX: Other seasonal allergic rhinitis: J30.2

## 2013-07-17 HISTORY — PX: MASS EXCISION: SHX2000

## 2013-07-17 SURGERY — MINOR EXCISION OF MASS
Anesthesia: LOCAL | Site: Finger | Laterality: Left

## 2013-07-17 MED ORDER — LIDOCAINE HCL 2 % IJ SOLN
INTRAMUSCULAR | Status: DC | PRN
  Administered 2013-07-17: 6 mL

## 2013-07-17 MED ORDER — LIDOCAINE HCL 2 % IJ SOLN
INTRAMUSCULAR | Status: AC
  Filled 2013-07-17: qty 20

## 2013-07-17 SURGICAL SUPPLY — 43 items
BANDAGE ADH SHEER 1  50/CT (GAUZE/BANDAGES/DRESSINGS) IMPLANT
BANDAGE COBAN STERILE 2 (GAUZE/BANDAGES/DRESSINGS) IMPLANT
BLADE SURG 15 STRL LF DISP TIS (BLADE) ×1 IMPLANT
BLADE SURG 15 STRL SS (BLADE) ×2
BNDG CMPR 9X4 STRL LF SNTH (GAUZE/BANDAGES/DRESSINGS)
BNDG CMPR MD 5X2 ELC HKLP STRL (GAUZE/BANDAGES/DRESSINGS)
BNDG COHESIVE 1X5 TAN STRL LF (GAUZE/BANDAGES/DRESSINGS) ×1 IMPLANT
BNDG ELASTIC 2 VLCR STRL LF (GAUZE/BANDAGES/DRESSINGS) IMPLANT
BNDG ESMARK 4X9 LF (GAUZE/BANDAGES/DRESSINGS) IMPLANT
BRUSH SCRUB EZ PLAIN DRY (MISCELLANEOUS) ×2 IMPLANT
CORDS BIPOLAR (ELECTRODE) IMPLANT
COVER MAYO STAND STRL (DRAPES) ×2 IMPLANT
CUFF TOURNIQUET SINGLE 18IN (TOURNIQUET CUFF) ×1 IMPLANT
DECANTER SPIKE VIAL GLASS SM (MISCELLANEOUS) IMPLANT
DRAIN PENROSE 1/2X12 LTX STRL (WOUND CARE) IMPLANT
DRAPE SURG 17X23 STRL (DRAPES) ×2 IMPLANT
GAUZE XEROFORM 1X8 LF (GAUZE/BANDAGES/DRESSINGS) IMPLANT
GLOVE BIOGEL M STRL SZ7.5 (GLOVE) ×1 IMPLANT
GLOVE BIOGEL PI IND STRL 7.0 (GLOVE) IMPLANT
GLOVE BIOGEL PI INDICATOR 7.0 (GLOVE) ×1
GLOVE ECLIPSE 7.0 STRL STRAW (GLOVE) ×1 IMPLANT
GLOVE ORTHO TXT STRL SZ7.5 (GLOVE) ×2 IMPLANT
GOWN STRL REUS W/ TWL LRG LVL3 (GOWN DISPOSABLE) ×1 IMPLANT
GOWN STRL REUS W/TWL LRG LVL3 (GOWN DISPOSABLE) ×2
GOWN STRL REUS W/TWL XL LVL3 (GOWN DISPOSABLE) ×2 IMPLANT
NDL SAFETY ECLIPSE 18X1.5 (NEEDLE) IMPLANT
NEEDLE 27GAX1X1/2 (NEEDLE) ×1 IMPLANT
NEEDLE HYPO 18GX1.5 SHARP (NEEDLE) ×2
PACK BASIN DAY SURGERY FS (CUSTOM PROCEDURE TRAY) ×2 IMPLANT
PADDING CAST ABS 4INX4YD NS (CAST SUPPLIES)
PADDING CAST ABS COTTON 4X4 ST (CAST SUPPLIES) ×1 IMPLANT
SPONGE GAUZE 4X4 12PLY (GAUZE/BANDAGES/DRESSINGS) ×1 IMPLANT
SPONGE GAUZE 4X4 12PLY STER LF (GAUZE/BANDAGES/DRESSINGS) ×2 IMPLANT
STOCKINETTE 4X48 STRL (DRAPES) ×2 IMPLANT
STRIP CLOSURE SKIN 1/2X4 (GAUZE/BANDAGES/DRESSINGS) ×1 IMPLANT
SUT ETHILON 5 0 P 3 18 (SUTURE)
SUT NYLON ETHILON 5-0 P-3 1X18 (SUTURE) ×1 IMPLANT
SUT PROLENE 4 0 P 3 18 (SUTURE) ×1 IMPLANT
SYR 3ML 23GX1 SAFETY (SYRINGE) IMPLANT
SYR CONTROL 10ML LL (SYRINGE) ×1 IMPLANT
TOWEL OR 17X24 6PK STRL BLUE (TOWEL DISPOSABLE) ×4 IMPLANT
TRAY DSU PREP LF (CUSTOM PROCEDURE TRAY) ×2 IMPLANT
UNDERPAD 30X30 INCONTINENT (UNDERPADS AND DIAPERS) ×2 IMPLANT

## 2013-07-17 NOTE — Op Note (Signed)
[id number] 

## 2013-07-17 NOTE — Discharge Instructions (Signed)

## 2013-07-17 NOTE — Brief Op Note (Signed)
07/17/2013  8:12 AM  PATIENT:  Jamesetta Geralds  63 y.o. male  PRE-OPERATIVE DIAGNOSIS:  LEFT DORSAL INDEX MASS  POST-OPERATIVE DIAGNOSIS:  LEFT DORSAL INDEX MASS  PROCEDURE:  Procedure(s): EXCISIONAL BIOPSY LEFT INDEX  (Left)  SURGEON:  Surgeon(s) and Role:    * Cammie Sickle., MD - Primary  PHYSICIAN ASSISTANT:   ASSISTANTS: surgical tech   ANESTHESIA:   local  EBL:     BLOOD ADMINISTERED:none  DRAINS: none   LOCAL MEDICATIONS USED:  LIDOCAINE   SPECIMEN:  No Specimen  DISPOSITION OF SPECIMEN:  N/A  COUNTS:  YES  TOURNIQUET:   Total Tourniquet Time Documented: Upper Arm (Left) - 11 minutes Total: Upper Arm (Left) - 11 minutes   DICTATION: .Other Dictation: Dictation Number [id number]  PLAN OF CARE: Discharge to home after PACU  PATIENT DISPOSITION:  PACU - hemodynamically stable.   Delay start of Pharmacological VTE agent (>24hrs) due to surgical blood loss or risk of bleeding: not applicable

## 2013-07-18 NOTE — Op Note (Signed)
NAME:  [name] Ayala, [name] Ayala NO.:  [id number]  MEDICAL RECORD NO.:  [id number]  LOCATION:                                 FACILITY:  PHYSICIAN:  Youlanda Mighty. Kiyra Slaubaugh, M.D.      DATE OF BIRTH:  DATE OF PROCEDURE:  07/17/2013 DATE OF DISCHARGE:                              OPERATIVE REPORT   PREOPERATIVE DIAGNOSIS:  Mass, dorsal aspect, left index finger.  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  OPERATION:  Marginal resection of mass, dorsal radial aspect left index finger, proximal phalangeal segment.  OPERATING SURGEON:  Youlanda Mighty. Hassell Patras, M.D.  ASSISTANT:  Surgical technician.  ANESTHESIA:  2% lidocaine metacarpal head level block, 6 mL, total volume of 2% plain lidocaine.  This was a minor operating room procedure.  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  He has a history of thyroid cancer.  He noted a mass on the dorsoradial aspect of his left index finger proximal phalangeal segment.  He consulted approximately 4 weeks prior.  I advised him to observe this for 4-6 weeks.  Due to his history of malignancy, he was quite eager to have this biopsy to rule out a mitotic process.  Due to his insistence, he was brought to the operating room at this time for an incisional evaluation and likely excisional biopsy.  After informed consent, he was brought to the operating room.  Preoperatively, he was interviewed in the holding area.  His left index finger was marked as a proper surgical site per protocol with a marking pen.  Questions were invited and answered in detail.  DESCRIPTION OF PROCEDURE:  [name] Ayala was brought to room #2 of the [facility] and placed in  supine position on the operating table.  After confirming the proper surgical site, his left hand and index finger were prepped with Betadine and 2% lidocaine infiltrated with metacarpal head level to obtain a digital block.  The left hand and arm were then prepped with Betadine soap and solution, sterilely draped.  A pneumatic tourniquet was applied to the proximal left brachium.  Following exsanguination of the left arm with Esmarch bandage, the arterial tourniquet was inflated to 220 mmHg.  Following routine surgical time-out, procedure commenced with a 1.5-cm longitudinal incision directly over the apex of the mass.  Subcutaneous tissues were carefully divided.  The extensor mechanism was retracted dorsally and a translucent, slightly purplish mass was identified.  This was circumferentially dissected, found to have myxoid fluid within the mass. This was excised directly off the periosteum.  The PIP joint was explored and what appeared to be a periosteal connection was removed with a rongeur.  This likely was a myxoid cyst emanating from the PIP joint capsule.  A limited synovectomy at the PIP joint was accomplished.  The wound was inspected for bleeding points.  The mass was placed in formalin and passed off for pathologic evaluation.  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  Its origin could have been periosteal, but more likely than not was from the PIP joint capsule tracking along the periosteum.  The wound was then repaired with intradermal 4-0 Prolene suture and a Steri-Strip.  The tourniquet was released with immediate capillary refill to the fingers and thumb.  Pressure was held on the wound for few moments.  The finger was then dressed with sterile gauze and Coban.  We advised [name] Ayala that he could remove his dressing in 3 days and begin using Band-Aids.  We will see him back in followup in a week  to discuss his pathology and remove his suture.  He may work out today as long as he does not get the wound wet.  Note that he declined an opportunity to have a prescription provided for pain medication.     Youlanda Mighty Thurman Sarver, M.D.     RVS/MEDQ  D:  07/17/2013  T:  07/18/2013  Job:  [id number]

## 2013-07-19 ENCOUNTER — Encounter (HOSPITAL_BASED_OUTPATIENT_CLINIC_OR_DEPARTMENT_OTHER): Payer: Self-pay | Admitting: Orthopedic Surgery

## 2014-04-25 ENCOUNTER — Other Ambulatory Visit: Payer: Self-pay | Admitting: *Deleted

## 2014-04-25 DIAGNOSIS — I7121 Aneurysm of the ascending aorta, without rupture: Secondary | ICD-10-CM

## 2014-04-25 DIAGNOSIS — I712 Thoracic aortic aneurysm, without rupture: Secondary | ICD-10-CM

## 2014-05-29 ENCOUNTER — Encounter: Payer: Self-pay | Admitting: Surgery

## 2014-05-29 ENCOUNTER — Ambulatory Visit
Admission: RE | Admit: 2014-05-29 | Discharge: 2014-05-29 | Disposition: A | Discharge status: Home / Self Care | Payer: Medicare Other | Source: Ambulatory Visit | Attending: Surgery | Admitting: Surgery

## 2014-05-29 ENCOUNTER — Ambulatory Visit (INDEPENDENT_AMBULATORY_CARE_PROVIDER_SITE_OTHER): Payer: Medicare Other | Admitting: Surgery

## 2014-05-29 VITALS — BP 150/92 | HR 85 | Resp 16 | Ht 73.0 in | Wt 230.0 lb

## 2014-05-29 DIAGNOSIS — I7121 Aneurysm of the ascending aorta, without rupture: Secondary | ICD-10-CM

## 2014-05-29 DIAGNOSIS — I712 Thoracic aortic aneurysm, without rupture: Secondary | ICD-10-CM

## 2014-05-29 MED ORDER — GADOBENATE DIMEGLUMINE 529 MG/ML IV SOLN
20.0000 mL | Freq: Once | INTRAVENOUS | Status: AC | PRN
  Administered 2014-05-29: 20 mL via INTRAVENOUS

## 2014-05-31 ENCOUNTER — Other Ambulatory Visit (HOSPITAL_COMMUNITY): Payer: Self-pay | Admitting: Orthopedic Surgery

## 2014-05-31 DIAGNOSIS — M85442 Solitary bone cyst, left hand: Secondary | ICD-10-CM

## 2014-06-02 ENCOUNTER — Encounter: Payer: Self-pay | Admitting: Surgery

## 2014-06-02 NOTE — Progress Notes (Signed)
      HPI:  He returns today for followup of a fusiform ascending aortic aneurysm. I last saw him on 05/30/2012 at which time MR angiogram showed the maximum diameter of the ascending aorta to be 4.7 cm which had not changed from the prior scan on 09/15/10. He reports that he has been feeling well without chest pain or shortness of breath. He remains physically very active.  Current Outpatient Prescriptions  Medication Sig Dispense Refill  . aspirin 81 MG tablet Take 81 mg by mouth daily.    Marland Kitchen atorvastatin (LIPITOR) 40 MG tablet Take 40 mg by mouth daily.     Marland Kitchen diltiazem (CARDIZEM CD) 180 MG 24 hr capsule Take 180 mg by mouth daily.     . flecainide (TAMBOCOR) 50 MG tablet Take 50 mg by mouth 2 (two) times daily.     Marland Kitchen SYNTHROID 200 MCG tablet Take 200 mcg by mouth daily.      No current facility-administered medications for this visit.     Physical Exam:  BP 150/92 mmHg  Pulse 85  Resp 16  Ht 6\' 1"  (1.854 m)  Wt 230 lb (104.327 kg)  BMI 30.35 kg/m2  SpO2 98% He looks well. Cardiac exam shows a regular rate and rhythm with normal heart sounds. There is no murmur. Lung exam is clear.   Diagnostic Tests:  CLINICAL DATA: Evaluate ascending thoracic aortic aneurysm.  EXAM: MRA CHEST WITH AND WITHOUT CONTRAST  TECHNIQUE: Angiographic images of the chest were obtained using MRA technique without and with intravenous contrast.  CONTRAST: 24mL MULTIHANCE GADOBENATE DIMEGLUMINE 529 MG/ML IV SOLN  COMPARISON: MRI 05/30/2012 and CT 03/07/2008  FINDINGS: The ascending thoracic aorta measures 4.7 cm at the level of the pulmonary artery bifurcation. The proximal ascending thoracic aorta measures 4.9 cm at the level of the right main pulmonary artery and this is unchanged since 03/07/2008. Limited evaluation of the aortic root at the sinuses of Valsalva but no significant change from the previous examination. The descending thoracic aorta measures 3.1 cm and previously  measured 2.9 cm. Aortic arch measures 3.4 cm and unchanged. No large filling defects in the central pulmonary arteries. Typical aortic arch anatomy.  No significant chest lymphadenopathy. No significant pericardial or pleural fluid.  IMPRESSION: Stable enlargement of the ascending thoracic aorta measuring up to 4.9 cm. Limited evaluation of the aortic root but minimal change. Ascending thoracic aortic aneurysm. Recommend semi-annual imaging followup by CTA or MRA and referral to cardiothoracic surgery if not already obtained. This recommendation follows 2010 ACCF/AHA/AATS/ACR/ASA/SCA/SCAI/SIR/STS/SVM Guidelines for the Diagnosis and Management of Patients With Thoracic Aortic Disease.  Circulation. 2010; 121: K025-K270   Electronically Signed  By: Markus Daft M.D.  On: 05/29/2014 13:26  Impression:  He has a stable 4.9 cm fusiform ascending aortic aneurysm that has not changed since it was initially diagnosed in 2009. The measurement is 2 mm larger than it was 2 years ago but that is minimal change and is probably within the range of error. He said that he has had good blood pressure control at home with a systolic blood pressure around 120. He feels that his blood pressure is elevated today due to the stress of his appointment. I recommended that we repeat his MR angiogram in 1 years to followup on this aneurysm.  Plan:  I will plan to see him back in about 1 years with an MR angiogram of the chest.

## 2014-06-04 ENCOUNTER — Ambulatory Visit (HOSPITAL_COMMUNITY): Payer: Medicare Other

## 2014-06-04 ENCOUNTER — Encounter (HOSPITAL_COMMUNITY)
Admission: RE | Admit: 2014-06-04 | Discharge: 2014-06-04 | Disposition: A | Discharge status: Home / Self Care | Payer: Medicare Other | Source: Ambulatory Visit | Attending: Orthopedic Surgery | Admitting: Orthopedic Surgery

## 2014-06-04 DIAGNOSIS — M85442 Solitary bone cyst, left hand: Secondary | ICD-10-CM | POA: Diagnosis present

## 2014-06-04 MED ORDER — TECHNETIUM TC 99M MEDRONATE IV KIT
25.0000 | PACK | Freq: Once | INTRAVENOUS | Status: AC | PRN
  Administered 2014-06-04: 25 via INTRAVENOUS

## 2015-05-20 ENCOUNTER — Other Ambulatory Visit: Payer: Self-pay | Admitting: *Deleted

## 2015-05-20 DIAGNOSIS — I7121 Aneurysm of the ascending aorta, without rupture: Secondary | ICD-10-CM

## 2015-05-20 DIAGNOSIS — I712 Thoracic aortic aneurysm, without rupture: Secondary | ICD-10-CM

## 2015-06-11 ENCOUNTER — Ambulatory Visit
Admission: RE | Admit: 2015-06-11 | Discharge: 2015-06-11 | Disposition: A | Discharge status: Home / Self Care | Payer: Medicare Other | Source: Ambulatory Visit | Attending: Surgery | Admitting: Surgery

## 2015-06-11 ENCOUNTER — Ambulatory Visit (INDEPENDENT_AMBULATORY_CARE_PROVIDER_SITE_OTHER): Payer: Medicare Other | Admitting: Surgery

## 2015-06-11 ENCOUNTER — Encounter: Payer: Self-pay | Admitting: Surgery

## 2015-06-11 VITALS — BP 167/93 | HR 77 | Resp 20 | Ht 73.0 in | Wt 220.0 lb

## 2015-06-11 DIAGNOSIS — I712 Thoracic aortic aneurysm, without rupture: Secondary | ICD-10-CM

## 2015-06-11 DIAGNOSIS — I7121 Aneurysm of the ascending aorta, without rupture: Secondary | ICD-10-CM

## 2015-06-11 MED ORDER — GADOBENATE DIMEGLUMINE 529 MG/ML IV SOLN
20.0000 mL | Freq: Once | INTRAVENOUS | Status: AC | PRN
  Administered 2015-06-11: 20 mL via INTRAVENOUS

## 2015-06-11 NOTE — Progress Notes (Signed)
      HPI:   He returns today for followup of a fusiform ascending aortic aneurysm. I last saw him on 06/02/2014 at which time MR angiogram showed the maximum diameter of the ascending aorta to be 4.9 cm which had not changed significantly from his prior scans dating back to 39/17/2009. He reports that he has been feeling well without chest pain or shortness of breath. He remains physically very active.  Current Outpatient Prescriptions  Medication Sig Dispense Refill  . aspirin 81 MG tablet Take 81 mg by mouth daily.    Marland Kitchen atorvastatin (LIPITOR) 40 MG tablet Take 40 mg by mouth daily.     Marland Kitchen diltiazem (CARDIZEM CD) 180 MG 24 hr capsule Take 180 mg by mouth daily.     . flecainide (TAMBOCOR) 50 MG tablet Take 50 mg by mouth 2 (two) times daily.     Marland Kitchen SYNTHROID 200 MCG tablet Take 200 mcg by mouth daily.      No current facility-administered medications for this visit.     Physical Exam: BP 167/93 mmHg  Pulse 77  Resp 20  Ht 6\' 1"  (1.854 m)  Wt 220 lb (99.791 kg)  BMI 29.03 kg/m2  SpO2 98% He looks well. Cardiac exam shows a regular rate and rhythm with normal heart sounds. There is no murmur. Lung exam is clear.   Diagnostic Tests:  CLINICAL DATA: Ascending aortic aneurysm, currently asymptomatic. History of thyroid carcinoma.  EXAM: MRA CHEST WITH OR WITHOUT CONTRAST  TECHNIQUE: Angiographic images of the chest were obtained using MRA technique without and with intravenous contrast.  CONTRAST: 46mL MULTIHANCE GADOBENATE DIMEGLUMINE 529 MG/ML IV SOLN  Creatinine was obtained on site at [place] at [address]  Results: Creatinine 1.2 mg/dL.  COMPARISON: 05/29/2014  FINDINGS: Ascending aortic aneurysm with measurements of maximum transverse diameters as follows:  3.6 cm sino-tubular junction  4.9 cm proximal ascending (stable since prior study)  4.3 cm distal ascending/ proximal arch  3.1 cm distal arch/ proximal  descending  2.8 cm distal descending  No stenosis or dissection. Classic 3 vessel brachiocephalic arterial origin anatomy without proximal stenosis. No significant atheromatous irregularity.  No evident hilar or mediastinal adenopathy. No pleural or pericardial effusion. Limited assessment of regional bones and upper abdomen unremarkable.  IMPRESSION: 1. Stable 4.9 cm ascending aortic aneurysm, without complicating features. Recommend semi-annual imaging followup by CTA or MRA and referral to cardiothoracic surgery if not already obtained. This recommendation follows 2010 ACCF/AHA/AATS/ACR/ASA/SCA/SCAI/SIR/STS/SVM Guidelines for the Diagnosis and Management of Patients With Thoracic Aortic Disease. Circulation. 2010; 121PV:2030509   Electronically Signed  By: Lucrezia Europe M.D.  On: 06/11/2015 11:35   Impression:  He has a stable 4.9 cm ascending aortic aneurysm that has not changed significantly since 2009. His BP is high today but he says that he checks it regularly at home and it is always less than [id number] systolic. There is no indication for surgical intervention at this time and I have recommended continue yearly follow up with MRA.    Plan:  He will return in one year with MRA of the chest.   Gaye Pollack, MD [facility] [phone]

## 2016-04-30 ENCOUNTER — Other Ambulatory Visit: Payer: Self-pay | Admitting: *Deleted

## 2016-04-30 DIAGNOSIS — I712 Thoracic aortic aneurysm, without rupture: Secondary | ICD-10-CM

## 2016-04-30 DIAGNOSIS — I7121 Aneurysm of the ascending aorta, without rupture: Secondary | ICD-10-CM

## 2016-06-02 ENCOUNTER — Ambulatory Visit (INDEPENDENT_AMBULATORY_CARE_PROVIDER_SITE_OTHER): Payer: Medicare Other | Admitting: Surgery

## 2016-06-02 ENCOUNTER — Ambulatory Visit
Admission: RE | Admit: 2016-06-02 | Discharge: 2016-06-02 | Disposition: A | Discharge status: Home / Self Care | Payer: Medicare Other | Source: Ambulatory Visit | Attending: Surgery | Admitting: Surgery

## 2016-06-02 ENCOUNTER — Encounter: Payer: Self-pay | Admitting: Surgery

## 2016-06-02 VITALS — BP 158/89 | HR 86 | Resp 16 | Ht 73.0 in | Wt 209.0 lb

## 2016-06-02 DIAGNOSIS — I712 Thoracic aortic aneurysm, without rupture: Secondary | ICD-10-CM

## 2016-06-02 DIAGNOSIS — I7121 Aneurysm of the ascending aorta, without rupture: Secondary | ICD-10-CM

## 2016-06-02 MED ORDER — GADOBENATE DIMEGLUMINE 529 MG/ML IV SOLN: 20.0000 mL | Freq: Once | INTRAVENOUS | Status: DC | PRN

## 2016-06-03 ENCOUNTER — Encounter: Payer: Self-pay | Admitting: Surgery

## 2016-06-03 NOTE — Progress Notes (Signed)
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 I last saw [name] Ayala on 06/11/2015 at which time MR angiogram showed the maximum diameter of the ascending aorta to be 4.9 cm which had not changed significantly from his prior scans dating back to 2009. He reports that he has been feeling well without chest pain or shortness of breath. He remains physically very active and has been watching his diet closely and lost weight.  Current Outpatient Prescriptions  Medication Sig Dispense Refill  . aspirin 81 MG tablet Take 81 mg by mouth daily.    Marland Kitchen atorvastatin (LIPITOR) 40 MG tablet Take 40 mg by mouth daily.     Marland Kitchen diltiazem (CARDIZEM CD) 180 MG 24 hr capsule Take 180 mg by mouth daily.     . flecainide (TAMBOCOR) 50 MG tablet Take 50 mg by mouth 2 (two) times daily.     Marland Kitchen losartan (COZAAR) 25 MG tablet Take 25 mg by mouth daily.    Marland Kitchen SYNTHROID 200 MCG tablet Take 200 mcg by mouth daily.      No current facility-administered medications for this visit.      Physical Exam: BP (!) 158/89 (BP Location: Right Arm, Patient Position: Sitting, Cuff Size: Large)   Pulse 86   Resp 16   Ht 6\' 1"  (1.854 m)   Wt 209 lb (94.8 kg)   SpO2 97% Comment: ON RA  BMI 27.57 kg/m  He looks well. Cardiac exam shows a regular rate and rhythm with normal heart sounds. There is no murmur. Lung exam is clear.  Diagnostic Tests:  CLINICAL DATA:  Follow-up of ascending thoracic aortic aneurysm  EXAM: MRA CHEST WITH CONTRAST  TECHNIQUE: Angiographic images of the chest were obtained using MRA technique with intravenous contrast.  CONTRAST:  20 cc MultiHance IV  Creatinine was obtained on site at [facility] at [address]  Results: Creatinine 1.1 mg/dL.  COMPARISON:  Chest MRA a- 06/11/2015; 09/09/2008; chest CT - 03/07/2008  FINDINGS: Vascular Findings:  Grossly unchanged fusiform aneurysmal dilatation of the ascending thoracic aorta  was measurements as follows. The thoracic aorta tapers to a normal caliber at the level of the aortic arch. No definite evidence of thoracic aortic dissection on this nongated examination.  Conventional configuration of the aortic arch. The branch vessels of the aortic arch appear patent throughout their imaged course.  Normal heart size.  No pericardial effusion.  Although this examination was not tailored for the evaluation the pulmonary arteries, there are no discrete filling defects within the central pulmonary arterial tree to suggest central pulmonary embolism. Enlarged caliber the main pulmonary artery measuring 37 mm in diameter, similar to chest CTA performed 02/2008.  -------------------------------------------------------------  Thoracic aortic measurements:  Sinotubular junction  39 mm as measured in greatest oblique coronal dimension (image 50, series 10).  Proximal ascending aorta  47 mm as measured in greatest oblique axial dimension at the level of the main pulmonary artery (image 35, series 3) and approximately 45 mm in greatest oblique sagittal diameter (sagittal image 39, series 10), grossly unchanged since remote chest CTA performed 02/2008.  Aortic arch aorta  33 mm as measured in greatest oblique sagittal dimension (sagittal image 44, series 10).  Proximal descending thoracic aorta  30 mm as measured in greatest oblique axial dimension at the level of the main pulmonary artery (image 35, series 3).  Distal descending thoracic aorta  20 mm as measured in greatest oblique sagittal  dimension at the level of the diaphragmatic hiatus (image 37, series 10).  Review of the MIP images confirms the above findings.  -------------------------------------------------------------  Non-Vascular Findings:  Mediastinum/Lymph Nodes: No mediastinal, hilar axillary lymphadenopathy.  Lungs/Pleura: No pleural effusion.  No focal airspace  opacities.  Upper abdomen: Limited evaluation of the upper abdomen is unremarkable.  Musculoskeletal: Regional osseous and soft tissue structures are unremarkable  IMPRESSION: Stable uncomplicated fusiform aneurysmal dilatation of the ascending thoracic aorta measuring 47 mm in diameter, grossly unchanged compared to remote chest CTA performed 02/2008.   Electronically Signed   By: Sandi Mariscal M.D.   On: 06/02/2016 09:10  Impression:  He has a stable 4.7 cm fusiform ascending aortic aneurysm. It was measured at 4.9 cm last year and I think the variation is due to technique and where the diameter is measured. It has been stable since 2009. His BP is elevated today but he says he checks it frequently at home and it is never higher than [id number] systolic. He understands the importance of good BP control.  Plan:  I will see [name] Ayala back in one year with an MRA of the chest.   Gaye Pollack, MD [facility] [phone]

## 2016-06-16 ENCOUNTER — Encounter: Payer: Medicare Other | Admitting: Surgery

## 2017-05-06 ENCOUNTER — Other Ambulatory Visit: Payer: Self-pay | Admitting: *Deleted

## 2017-05-06 DIAGNOSIS — I712 Thoracic aortic aneurysm, without rupture, unspecified: Secondary | ICD-10-CM

## 2017-06-01 ENCOUNTER — Other Ambulatory Visit: Payer: Medicare Other

## 2017-06-01 ENCOUNTER — Other Ambulatory Visit: Payer: Self-pay | Admitting: *Deleted

## 2017-06-01 ENCOUNTER — Ambulatory Visit (INDEPENDENT_AMBULATORY_CARE_PROVIDER_SITE_OTHER): Payer: Medicare Other | Admitting: Surgery

## 2017-06-01 ENCOUNTER — Ambulatory Visit: Admission: RE | Admit: 2017-06-01 | Payer: Medicare Other | Source: Ambulatory Visit

## 2017-06-01 ENCOUNTER — Other Ambulatory Visit: Payer: Self-pay

## 2017-06-01 ENCOUNTER — Encounter: Payer: Self-pay | Admitting: Surgery

## 2017-06-01 VITALS — BP 159/90 | HR 86 | Ht 73.0 in | Wt 220.0 lb

## 2017-06-01 DIAGNOSIS — I712 Thoracic aortic aneurysm, without rupture, unspecified: Secondary | ICD-10-CM

## 2017-06-01 NOTE — Progress Notes (Signed)
     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 I last saw him on 06/02/2016 at which time MR angiogram showed the maximum diameter of the ascending aorta to be 4.7 cm which had not changed significantly from his prior scans dating back to 2009. It was measured at 4.9 cm in 2016. He reports that he has been feeling well without chest pain or shortness of breath. He remains physically very active.  Since I saw him last year he underwent an ablation for atrial flutter.  He did not have a scan yet this year.  He wanted to come into the office to talk to me before having any scans done.  He has concerns about getting gadolinium for his MRI and is adamant that he does not want a CT scan of the chest due to the radiation exposure.     Current Outpatient Medications  Medication Sig Dispense Refill  . atorvastatin (LIPITOR) 40 MG tablet Take 40 mg by mouth daily.     Marland Kitchen diltiazem (CARDIZEM CD) 180 MG 24 hr capsule Take 180 mg by mouth daily.     . flecainide (TAMBOCOR) 50 MG tablet Take 50 mg by mouth 2 (two) times daily.     Marland Kitchen losartan (COZAAR) 25 MG tablet Take 25 mg by mouth daily.    Marland Kitchen losartan (COZAAR) 50 MG tablet Take 50 mg by mouth.    . rivaroxaban (XARELTO) 20 MG TABS tablet Take 20 mg by mouth.    . SYNTHROID 200 MCG tablet Take 200 mcg by mouth daily.      No current facility-administered medications for this visit.      Physical Exam: BP (!) 159/90   Pulse 86   Ht 6\' 1"  (1.854 m)   Wt 220 lb (99.8 kg)   SpO2 98%   BMI 29.03 kg/m  He looks well. Cardiac exam shows a regular rate and rhythm with normal heart sounds. There is no murmur. Lung exam is clear.   Diagnostic Tests:  None today  Impression:  I discussed his concerns with receiving gadolinium for an MRI as well as radiation exposure with CT scanning.  I did contact radiology and the radiologist that talked to today felt that we could do an MRI without gadolinium but it  would not be as good of the study.  He did feel that we would probably be able to get an accurate assessment of the size of his aneurysm.  I discussed this with the patient by telephone after he left and we decided to proceed ahead with an MRI without gadolinium.  Plan:  He will have an MRI without gadolinium to assess his thoracic aneurysm and I will see him back afterwards to review the study with him.  I spent 10 minutes performing this established patient evaluation and > 50% of this time was spent face to face counseling and coordinating the care of this patient's aortic aneurysm.    Gaye Pollack, MD [facility] [phone]

## 2017-06-03 ENCOUNTER — Ambulatory Visit
Admission: RE | Admit: 2017-06-03 | Discharge: 2017-06-03 | Disposition: A | Discharge status: Home / Self Care | Payer: Medicare Other | Source: Ambulatory Visit | Attending: Surgery | Admitting: Surgery

## 2017-06-03 DIAGNOSIS — I712 Thoracic aortic aneurysm, without rupture, unspecified: Secondary | ICD-10-CM

## 2017-06-15 ENCOUNTER — Ambulatory Visit (INDEPENDENT_AMBULATORY_CARE_PROVIDER_SITE_OTHER): Payer: Medicare Other | Admitting: Surgery

## 2017-06-15 ENCOUNTER — Other Ambulatory Visit: Payer: Self-pay

## 2017-06-15 ENCOUNTER — Encounter: Payer: Self-pay | Admitting: Surgery

## 2017-06-15 VITALS — BP 151/82 | HR 72 | Resp 18 | Ht 73.0 in | Wt 220.0 lb

## 2017-06-15 DIAGNOSIS — I7121 Aneurysm of the ascending aorta, without rupture: Secondary | ICD-10-CM

## 2017-06-15 DIAGNOSIS — I712 Thoracic aortic aneurysm, without rupture: Secondary | ICD-10-CM | POA: Diagnosis not present

## 2017-06-15 NOTE — Progress Notes (Signed)
     HPI:  The patient returns today to discuss results of his recent MRI of the chest for follow-up of his a sending aortic aneurysm.  Current Outpatient Medications  Medication Sig Dispense Refill  . atorvastatin (LIPITOR) 40 MG tablet Take 40 mg by mouth daily.     Marland Kitchen diltiazem (CARDIZEM CD) 180 MG 24 hr capsule Take 180 mg by mouth daily.     . flecainide (TAMBOCOR) 50 MG tablet Take 50 mg by mouth 2 (two) times daily.     Marland Kitchen losartan (COZAAR) 25 MG tablet Take 25 mg by mouth daily.    Marland Kitchen losartan (COZAAR) 50 MG tablet Take 50 mg by mouth.    . rivaroxaban (XARELTO) 20 MG TABS tablet Take 20 mg by mouth.    . SYNTHROID 200 MCG tablet Take 200 mcg by mouth daily.      No current facility-administered medications for this visit.      Physical Exam: BP (!) 151/82   Pulse 72   Resp 18   Ht 6\' 1"  (1.854 m)   Wt 220 lb (99.8 kg)   SpO2 98% Comment: on RA  BMI 29.03 kg/m  He looks well. Cardiac exam shows a regular rate and rhythm with normal heart sounds. There is no murmur. Lung exam is clear.   Diagnostic Tests:  CLINICAL DATA:  Follow-up thoracic aortic aneurysm  EXAM: MRA CHEST WITH OR WITHOUT CONTRAST  TECHNIQUE: Angiographic images of the chest were obtained using MRA technique without intravenous contrast.  CONTRAST:  The patient refused contrast  COMPARISON:  06/02/2016  FINDINGS: The study was ordered as an MRA with contrast, however the patient refused contrast administration. The study was performed with MRA technique without contrast. It is severely limited due to lack of contrast.  VASCULAR  Maximal diameter of the ascending aorta at the sinus of all saw vial, sino-tubular junction, and ascending aorta are 4.2 cm, 3.9 cm, and 4.6 cm respectively. This compares with a maximal diameter in the ascending aorta of 4.7 cm on the prior study. Allowing for differences in measuring technique, there has been no significant change. There is no  obvious intramural hematoma or aortic dissection. Patency of the great vessels cannot be assessed on this study due to limitations.  NON-VASCULAR  There is no obvious mediastinal mass effect. Lungs are grossly clear.  IMPRESSION: VASCULAR  Limited exam as described. The patient refused intravenous contrast.  Maximal diameter of the ascending aorta is not significantly changed at 4.6 cm. Ascending thoracic aortic aneurysm. Recommend semi-annual imaging followup by CTA or MRA and referral to cardiothoracic surgery if not already obtained. This recommendation follows 2010 ACCF/AHA/AATS/ACR/ASA/SCA/SCAI/SIR/STS/SVM Guidelines for the Diagnosis and Management of Patients With Thoracic Aortic Disease. Circulation. 2010; 121: P382-N053  NON-VASCULAR  No obvious active cardiopulmonary disease.   Electronically Signed   By: Marybelle Killings M.D.   On: 06/04/2017 08:34   Impression:  The ascending aortic aneurysm is stable at 4.6 cm.  There is no indication for surgical treatment at this time.  I stressed the importance of good blood pressure control.  I reviewed the images with him and answered his questions.  Plan:  He will return to see me in 1 year for chest without contrast to continue follow-up of his ascending aortic aneurysm.   Gaye Pollack, MD [facility] [phone]

## 2018-05-03 ENCOUNTER — Other Ambulatory Visit: Payer: Self-pay | Admitting: *Deleted

## 2018-05-03 DIAGNOSIS — I712 Thoracic aortic aneurysm, without rupture, unspecified: Secondary | ICD-10-CM

## 2018-05-04 ENCOUNTER — Other Ambulatory Visit: Payer: Self-pay | Admitting: Surgery

## 2018-05-04 DIAGNOSIS — I712 Thoracic aortic aneurysm, without rupture, unspecified: Secondary | ICD-10-CM

## 2018-05-04 NOTE — Progress Notes (Unsigned)
mr

## 2018-05-31 ENCOUNTER — Ambulatory Visit
Admission: RE | Admit: 2018-05-31 | Discharge: 2018-05-31 | Disposition: A | Discharge status: Home / Self Care | Payer: Medicare Other | Source: Ambulatory Visit | Attending: Surgery | Admitting: Surgery

## 2018-05-31 ENCOUNTER — Encounter: Payer: Self-pay | Admitting: Surgery

## 2018-05-31 ENCOUNTER — Other Ambulatory Visit: Payer: Self-pay

## 2018-05-31 ENCOUNTER — Ambulatory Visit (INDEPENDENT_AMBULATORY_CARE_PROVIDER_SITE_OTHER): Payer: Medicare Other | Admitting: Surgery

## 2018-05-31 VITALS — BP 155/90 | HR 84 | Resp 18 | Ht 73.0 in | Wt 215.0 lb

## 2018-05-31 DIAGNOSIS — I7121 Aneurysm of the ascending aorta, without rupture: Secondary | ICD-10-CM

## 2018-05-31 DIAGNOSIS — I712 Thoracic aortic aneurysm, without rupture, unspecified: Secondary | ICD-10-CM

## 2018-05-31 NOTE — Progress Notes (Signed)
HPI:  The patient returns today for follow-up of a 4.6 cm fusiform ascending aortic aneurysm that has been stable since he was diagnosed in 2009.  CT scan and MRI studies since then have shown the size to be 4.6 to 4.8 cm depending on the study.  He continues to feel well overall with no chest pain or shortness of breath.  Since I saw him one year ago he had his right knee replaced and then in November of this year underwent robotic assisted prostatectomy for prostate cancer.  He is still recovering from that but doing well overall.  Current Outpatient Medications  Medication Sig Dispense Refill  . atorvastatin (LIPITOR) 40 MG tablet Take 40 mg by mouth daily.     Marland Kitchen diltiazem (CARDIZEM CD) 180 MG 24 hr capsule Take 180 mg by mouth daily.     . flecainide (TAMBOCOR) 50 MG tablet Take 50 mg by mouth 2 (two) times daily.     Marland Kitchen losartan (COZAAR) 50 MG tablet Take 50 mg by mouth.    . SYNTHROID 200 MCG tablet Take 200 mcg by mouth daily.      No current facility-administered medications for this visit.      Physical Exam: BP (!) 155/90 (BP Location: Left Arm, Patient Position: Sitting, Cuff Size: Large)   Pulse 84   Resp 18   Ht 6\' 1"  (1.854 m)   Wt 215 lb (97.5 kg)   SpO2 96% Comment: RA  BMI 28.37 kg/m  Looks well. Cardiac exam shows a regular rate and rhythm with normal heart sounds.  Diagnostic Tests:  CLINICAL DATA:  Pt refused contrast Follow up ascending aortic aneurysm, yearly exam. Hx prostate and thyroid ca. [place]  EXAM: MRA CHEST   WITHOUT CONTRAST  TECHNIQUE: Angiographic images of the chest were obtained using MRA technique without intravenous contrast.  CONTRAST:  Patient refused  COMPARISON:  06/03/2017  FINDINGS: VASCULAR  Aorta: No dissection or stenosis.  Transverse dimensions as follows:  4.1 cm sinuses of Valsalva  4 cm sino-tubular junction  4.6 cm mid ascending ( stable since previous)  4.2 cm distal ascending/proximal  arch  3.1 cm distal arch/proximal descending  2.6 cm distal descending  Heart: Normal in size. Gated imaging was not obtained. No pericardial effusion.  Pulmonary Arteries: Dilated centrally. Limited evaluation of segmental and subsegmental branches.  Other: No pleural effusion.  NON-VASCULAR  Spinal cord: Negative limited evaluation  Brachial plexus: Negative limited evaluation  Muscles and tendons: Negative limited evaluation  Bones: No focal lesion identified  Joints: Limited evaluation  IMPRESSION: VASCULAR  1. Stable 4.6 cm ascending thoracic aortic aneurysm. Recommend semi-annual imaging followup by CTA or MRA and referral to cardiothoracic surgery if not already obtained. This recommendation follows 2010 ACCF/AHA/AATS/ACR/ASA/SCA/SCAI/SIR/STS/SVM Guidelines for the Diagnosis and Management of Patients With Thoracic Aortic Disease. Circulation. 2010; 121: A193-X902 2. Dilated central pulmonary arteries suggesting pulmonary arterial hypertension.  NON-VASCULAR  1. No acute findings   Electronically Signed   By: Lucrezia Europe M.D.   On: 05/31/2018 13:37  Impression:  He has a stable 4.6 cm fusiform ascending aortic aneurysm.  This is still well below the surgical threshold of 5.5 cm and has been stable since 2009.  His blood pressure is mildly elevated in the office today but he usually is when he comes to a physician's office.  He said that he checks his blood pressure routinely at home and is always in the 409 systolic range.  He understands the  importance of keeping his blood pressure under good control.  I reviewed the MR images with him and answered all of his questions.  Plan:  I will plan to see him back in 1 year with an MRI of the chest without contrast which is giving [place] excellent visualization of his aorta.  I spent 10 minutes performing this established patient evaluation and > 50% of this time was spent face to face counseling and  coordinating the care of this patient's aortic aneurysm.    Gaye Pollack, MD [facility] [phone]

## 2019-05-08 ENCOUNTER — Other Ambulatory Visit: Payer: Self-pay | Admitting: *Deleted

## 2019-05-08 DIAGNOSIS — I712 Thoracic aortic aneurysm, without rupture, unspecified: Secondary | ICD-10-CM

## 2019-06-06 ENCOUNTER — Other Ambulatory Visit: Payer: Self-pay

## 2019-06-06 ENCOUNTER — Other Ambulatory Visit: Payer: Self-pay | Admitting: Surgery

## 2019-06-06 ENCOUNTER — Ambulatory Visit (INDEPENDENT_AMBULATORY_CARE_PROVIDER_SITE_OTHER): Payer: Medicare Other | Admitting: Surgery

## 2019-06-06 ENCOUNTER — Encounter: Payer: Self-pay | Admitting: Surgery

## 2019-06-06 ENCOUNTER — Ambulatory Visit
Admission: RE | Admit: 2019-06-06 | Discharge: 2019-06-06 | Disposition: A | Discharge status: Home / Self Care | Payer: Medicare Other | Source: Ambulatory Visit | Attending: Surgery | Admitting: Surgery

## 2019-06-06 VITALS — BP 152/95 | HR 77 | Temp 97.9°F | Resp 16 | Ht 73.0 in | Wt 215.0 lb

## 2019-06-06 DIAGNOSIS — I712 Thoracic aortic aneurysm, without rupture, unspecified: Secondary | ICD-10-CM

## 2019-06-06 DIAGNOSIS — I7121 Aneurysm of the ascending aorta, without rupture: Secondary | ICD-10-CM

## 2019-06-06 NOTE — Progress Notes (Signed)
HPI:  The patient returns today for follow-up of a 4.6-4.8 cm fusiform ascending aortic aneurysm that has been stable since he was diagnosed in 2009.  Since I saw him in December 2019 he has been feeling well without chest pain or shortness of breath.  He remains very active physically.  Current Outpatient Medications  Medication Sig Dispense Refill  . flecainide (TAMBOCOR) 50 MG tablet Take 50 mg by mouth 2 (two) times daily.     Marland Kitchen losartan (COZAAR) 50 MG tablet Take 50 mg by mouth.    . SYNTHROID 200 MCG tablet Take 200 mcg by mouth daily.     Marland Kitchen atorvastatin (LIPITOR) 40 MG tablet Take 40 mg by mouth daily.     Marland Kitchen diltiazem (CARDIZEM CD) 180 MG 24 hr capsule Take 180 mg by mouth daily.      No current facility-administered medications for this visit.     Physical Exam: BP (!) 152/95 (BP Location: Right Arm, Patient Position: Sitting, Cuff Size: Large) Comment: normal at home  Pulse 77   Temp 97.9 F (36.6 C)   Resp 16   Ht 6\' 1"  (1.854 m)   Wt 215 lb (97.5 kg)   SpO2 96% Comment: RA  BMI 28.37 kg/m  He looks well. Cardiac exam shows a regular rate and rhythm with normal heart sounds.  There is no murmur. Lungs are clear.  Diagnostic Tests:  CLINICAL DATA:  Evaluate thoracic aortic aneurysm  EXAM: MRA CHEST WITHOUT CONTRAST  TECHNIQUE: Angiographic images of the chest were obtained using MRA technique without intravenous contrast.  CONTRAST:  None  COMPARISON:  Chest MRA-05/31/2018; 06/03/2017; 06/02/2016; 06/11/2015; 05/30/2012; 09/15/2010  FINDINGS: Vascular Findings:  Stable fusiform aneurysmal dilatation of the ascending thoracic aorta with measurements as follows. The thoracic aorta tapers to a normal caliber at the level of the aortic arch. No evidence of thoracic aortic dissection or periaortic stranding on this nongated examination.  Conventional configuration of the aortic arch. The branch vessels of the aortic arch appear patent on  this noncontrast examination.  Cardiomegaly.  No pericardial effusion.  Although this examination was not tailored for the evaluation the pulmonary arteries, there are no discrete filling defects within the central pulmonary arterial tree to suggest central pulmonary embolism. Enlarged caliber the main pulmonary artery measuring 39 mm in diameter, unchanged.  -------------------------------------------------------------  Thoracic aortic measurements:  Sinotubular junction  40 mm as measured in greatest oblique short axis sagittal dimension.  Proximal ascending aorta  48 mm as measured in greatest oblique short axis axial dimension at the level of the main pulmonary artery, and approximately 46 mm in greatest oblique sagittal diameter (image 14, series 9), unchanged compared to the 2012 examination, previously, 47 mm in diameter respectively.  Aortic arch aorta  34 mm as measured in greatest oblique short axis sagittal dimension.  Proximal descending thoracic aorta  30 mm as measured in greatest oblique short axis axial dimension at the level of the main pulmonary artery.  Distal descending thoracic aorta  30 mm as measured in greatest oblique short axis axial dimension at the level of the diaphragmatic hiatus.  Review of the MIP images confirms the above findings.  -------------------------------------------------------------  Non-Vascular Findings:  Mediastinum/Lymph Nodes: No bulky mediastinal or hilar lymphadenopathy.  Lungs/Pleura: No discrete focal airspace opacities. No pleural effusion.  Upper abdomen: Limited evaluation of the upper abdomen is unremarkable.  Musculoskeletal: No definite acute or aggressive osseous abnormalities.  IMPRESSION: Stable uncomplicated fusiform aneurysmal dilatation of the  ascending thoracic aorta measuring 48 mm in maximal diameter, unchanged since at least the 08/2010 examination. Aortic aneurysm  NOS (ICD10-I71.9).   Electronically Signed   By: Sandi Mariscal M.D.   On: 06/06/2019 10:19   Impression:  This 68 year old gentleman has a stable 4.8 cm fusiform ascending aortic aneurysm which is not changed significantly since it was diagnosed in 2009.  The measurements have very good from 4.6 to 4.8 cm depending on the study.  I reviewed the images with him today and answered his questions.  I stressed the importance of continued good blood pressure control and preventing further enlargement and acute aortic dissection.  Plan:  I will plan to see him back in 1 year with an MRI of the chest without contrast.  I spent 15 minutes performing this established patient evaluation and > 50% of this time was spent face to face counseling and coordinating the care of this patient's aortic aneurysm.    Gaye Pollack, MD [facility] [phone]

## 2020-05-02 ENCOUNTER — Other Ambulatory Visit: Payer: Self-pay | Admitting: *Deleted

## 2020-05-02 DIAGNOSIS — I712 Thoracic aortic aneurysm, without rupture, unspecified: Secondary | ICD-10-CM

## 2020-06-04 ENCOUNTER — Ambulatory Visit (INDEPENDENT_AMBULATORY_CARE_PROVIDER_SITE_OTHER): Payer: Medicare Other | Admitting: Surgery

## 2020-06-04 ENCOUNTER — Ambulatory Visit
Admission: RE | Admit: 2020-06-04 | Discharge: 2020-06-04 | Disposition: A | Discharge status: Home / Self Care | Payer: Medicare Other | Source: Ambulatory Visit | Attending: Surgery | Admitting: Surgery

## 2020-06-04 ENCOUNTER — Other Ambulatory Visit: Payer: Self-pay

## 2020-06-04 ENCOUNTER — Encounter: Payer: Self-pay | Admitting: Surgery

## 2020-06-04 VITALS — BP 170/93 | HR 76 | Resp 18 | Ht 73.0 in | Wt 230.0 lb

## 2020-06-04 DIAGNOSIS — I712 Thoracic aortic aneurysm, without rupture, unspecified: Secondary | ICD-10-CM

## 2020-06-04 NOTE — Progress Notes (Signed)
HPI:  The patient returns today for follow-up of a 4.6-4.8 cm fusiform ascending aortic aneurysm that has been stable since he was diagnosed in 2009.  I have been following him yearly and he continues to feel well without chest pain or shortness of breath.  He is very active physically but avoids heavy weightlifting.  Current Outpatient Medications  Medication Sig Dispense Refill   atorvastatin (LIPITOR) 40 MG tablet Take 40 mg by mouth daily.      diltiazem (CARDIZEM CD) 180 MG 24 hr capsule Take 180 mg by mouth daily.      ezetimibe (ZETIA) 10 MG tablet Take 10 mg by mouth daily.     flecainide (TAMBOCOR) 50 MG tablet Take 50 mg by mouth 2 (two) times daily.      losartan (COZAAR) 50 MG tablet Take 50 mg by mouth.     SYNTHROID 200 MCG tablet Take 200 mcg by mouth daily.      No current facility-administered medications for this visit.     Physical Exam: BP (!) 170/93 (BP Location: Right Arm, Patient Position: Sitting)    Pulse 76    Resp 18    Ht 6\' 1"  (1.854 m)    Wt 230 lb (104.3 kg)    SpO2 96%    BMI 30.34 kg/m  He looks well. Cardiac exam shows a regular rate and rhythm with normal heart sounds.  There is no murmur. Lungs are clear  Diagnostic Tests:  Narrative & Impression  CLINICAL DATA:  Thoracic aortic aneurysm, follow-up  EXAM: MRA CHEST   WITHOUT CONTRAST  TECHNIQUE: Angiographic images of the chest were obtained using MRA technique without intravenous contrast.  CONTRAST:  None  COMPARISON:  06/06/2019 and previous  FINDINGS: Cardiovascular: Heart size normal. No pericardial effusion. Dilated central pulmonary arteries. The exam was not optimized for detection of pulmonary emboli. No evidence of thoracic aortic dissection or stenosis.  Aortic Root:  --Valve: 3.3 cm  --Sinuses: 4.3   cm  --Sinotubular Junction: 4.2 cm  Limitations by motion: Moderate  Thoracic Aorta:  --Ascending Aorta: 4.8 cm (stable compared to  previous)  --Aortic Arch: 3.5 cm  --Descending Aorta: 3.1  Classic 3 vessel brachiocephalic arterial origin anatomy without evident proximal stenosis.  Mediastinum/Nodes: No mass or adenopathy.  Lungs/Pleura: No pleural effusion.  No pulmonary mass identified.  Upper Abdomen: Negative limited evaluation  Musculoskeletal:  IMPRESSION: 1. Stable 4.8 cm ascending thoracic aortic aneurysm. 2. Dilated central pulmonary arteries suggesting pulmonary hypertension.   Electronically Signed   By: Lucrezia Europe M.D.   On: 06/04/2020 13:17      Impression:  This 69 year old gentleman has a stable 4.8 cm fusiform ascending aortic aneurysm which is not changed dating back to 2009.  This is still well below the surgical threshold of 5.5 cm.  I reviewed the MRA images with him and answered his questions.  I stressed the importance of continued good blood pressure control and preventing further enlargement and acute aortic dissection.  I reviewed the heavy weight lifting restrictions of about 35 pounds or less.  He has moved to [facility] but would like me to continue following his aneurysm.  Plan:  He will return to see me in 1 year with an MRA of the chest.  I spent 15 minutes performing this established patient evaluation and > 50% of this time was spent face to face counseling and coordinating the care of this patient's aortic aneurysm.    Gaspar Bidding  Alveria Apley, MD [facility] [phone]

## 2020-10-01 ENCOUNTER — Other Ambulatory Visit: Payer: Self-pay | Admitting: Gastroenterology

## 2020-10-01 DIAGNOSIS — D378 Neoplasm of uncertain behavior of other specified digestive organs: Secondary | ICD-10-CM

## 2020-10-01 DIAGNOSIS — D371 Neoplasm of uncertain behavior of stomach: Secondary | ICD-10-CM

## 2020-10-20 ENCOUNTER — Other Ambulatory Visit: Payer: Medicare Other

## 2021-06-24 ENCOUNTER — Encounter: Payer: Self-pay | Admitting: Surgery

## 2021-06-24 ENCOUNTER — Other Ambulatory Visit: Payer: Self-pay

## 2021-06-24 ENCOUNTER — Ambulatory Visit (INDEPENDENT_AMBULATORY_CARE_PROVIDER_SITE_OTHER): Payer: Medicare Other | Admitting: Surgery

## 2021-06-24 DIAGNOSIS — I7121 Aneurysm of the ascending aorta, without rupture: Secondary | ICD-10-CM | POA: Diagnosis not present

## 2021-06-24 NOTE — Progress Notes (Signed)
Patient ID: [name] Ayala, male   DOB: 1951-05-02, 71 y.o.   MRN: [id number]      [address]       [place] [address]             [place] VIRTUAL OFFICE NOTE  Referring Provider is Troy Sine, MD Primary Cardiologist is None PCP is Patient, No Pcp Per (Inactive)   HPI:  I spoke with [name] Ayala (DOB [birth year] ) via telephone on 06/24/2021 at 1:41 PM and verified that I was speaking with the correct person using more than one form of identification.  We discussed the fact that I was contacting them from my office and they were located at home, as well as the reason(s) for conducting our visit virtually instead of in-person.  The patient expressed understanding the circumstances and agreed to proceed as described.   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  I have been seeing him yearly and his last visit to the office was on 06/04/2020.  MRA of the chest at that time showed a stable 4.8 cm ascending aortic aneurysm.  He had an atrial flutter ablation at [facility] on 05/27/2021.  He had a cardiac CT scan done on 05/20/2021 in preparation for that which showed a stable 4.7 cm ascending aortic aneurysm.  The descending aortic diameter was 3 cm.  The main pulmonary artery was measured at 41 mm.  This was unchanged from prior scans.  There was atypical pulmonary venous anatomy with the left inferior and superior pulmonary veins forming a short common trunk before entering the left atrium.  There were 2 right pulmonary veins.  He continues to feel well without chest pain or shortness of breath.   Current Outpatient Medications  Medication Sig Dispense Refill   atorvastatin (LIPITOR) 40 MG tablet Take 40 mg by mouth daily.      diltiazem (CARDIZEM CD) 180 MG 24 hr capsule Take 180 mg by mouth daily.      ezetimibe  (ZETIA) 10 MG tablet Take 10 mg by mouth daily.     flecainide (TAMBOCOR) 50 MG tablet Take 50 mg by mouth 2 (two) times daily.      losartan (COZAAR) 50 MG tablet Take 50 mg by mouth.     SYNTHROID 200 MCG tablet Take 200 mcg by mouth daily.      No current facility-administered medications for this visit.     Diagnostic Tests:  Anatomical Region Laterality Modality  Chest -- Computed Tomography   Impression  Impression:  1. Atypical pulmonary venous anatomy. Specifically, the left inferior pulmonary vein and the left superior pulmonary vein form a short common trunk before entering the left atrium. There are 2 right pulmonary veins. No evidence of thrombus in the left atrial appendage.  2. Dilated ascending aorta (4.7cm) and aortic root (4.8cm).  3. 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  4. Moderately enlarged left atrium.  Narrative  CT HEART WITH CONTRAST - LEFT ATRIUM AND PULMONARY VEINS, 05/20/2021 10:24 AM   INDICATION: Atrial fibrillation pre or post ablation \ I48.3 Typical atrial flutter (HCC) \ I48.0 Paroxysmal atrial fibrillation [facility])   All CT scans at [facility] and [place] are performed using radiation dose optimization techniques as appropriate to a performed exam, including but not limited  to one or more of the following: automatic exposure control, adjustment of the mA and/or kV according to patient size, use of iterative reconstruction technique. In addition, our institution participates in a radiation dose monitoring program to optimize patient radiation exposure.   PREMEDICATION:  Omnipaque 350, 100 mL IV   FINDINGS:   Technical Quality: Acceptable.   Heart: Redundant mitral valve leaflet noted. Normal systemic and pulmonary venous drainage. Cardiac chambers are within normal limits. Dilated aortic root. No cardiac mass or thrombus. Appendage morphology (windsock). No visualized atrial appendage thrombus. No  pericardial effusion or calcification. No mitral annular calcification.   The coronary arteries were not optimized for analysis, nor is the focus of the study.   AORTA AND PULMONARY MEASUREMENTS:  Ascending aorta (<40 mm): 47.2 mm  Descending aorta (<40 mm): 29.9 mm  Main pulmonary artery: 41.42mm   Diameters:   RUPV: unable to measure, partly visualized  RLPV:  23.5 mm  LUPV:  unable to measure, partly visualized  LLPV:  19.9 mm   LA Diameter in systole: 49.1 mm   Esophagus position: The esophagus comes in closest proximity to the left inferior pulmonary venous ostium.     Extracardiac findings:  Atherosclerotic disease seen in the aorta. No incidental central pulmonary emboli. Procedure Note  Conni Slipper, MD - 06/01/2021  Formatting of this note might be different from the original.  [place] - LEFT ATRIUM AND PULMONARY VEINS, 05/20/2021 10:24 AM   INDICATION: Atrial fibrillation pre or post ablation \ I48.3 Typical atrial flutter (HCC) \ I48.0 Paroxysmal atrial fibrillation [facility])   All CT scans at [facility] and [employer] are performed using radiation dose optimization techniques as appropriate to a performed exam, including but not limited to one or more of the following: automatic exposure control, adjustment of the mA and/or kV according to patient size, use of iterative reconstruction technique. In addition, our institution participates in a radiation dose monitoring program to optimize patient radiation exposure.   PREMEDICATION:  Omnipaque 350, 100 mL IV   FINDINGS:   Technical Quality: Acceptable.   Heart: Redundant mitral valve leaflet noted. Normal systemic and pulmonary venous drainage. Cardiac chambers are within normal limits. Dilated aortic root. No cardiac mass or thrombus. Appendage morphology (windsock). No visualized atrial appendage thrombus. No pericardial effusion or calcification. No mitral annular  calcification.   The coronary arteries were not optimized for analysis, nor is the focus of the study.   AORTA AND PULMONARY MEASUREMENTS:  Ascending aorta (<40 mm): 47.2 mm  Descending aorta (<40 mm): 29.9 mm  Main pulmonary artery: 41.84mm   Diameters:   RUPV: unable to measure, partly visualized  RLPV:  23.5 mm  LUPV:  unable to measure, partly visualized  LLPV:  19.9 mm   LA Diameter in systole: 49.1 mm   Esophagus position: The esophagus comes in closest proximity to the left inferior pulmonary venous ostium.     Extracardiac findings:  Atherosclerotic disease seen in the aorta. No incidental central pulmonary emboli.   CONCLUSION:  Impression:  1. Atypical pulmonary venous anatomy. Specifically, the left inferior pulmonary vein and the left superior pulmonary vein form a short common trunk before entering the left atrium. There are 2 right pulmonary veins. No evidence of thrombus in the left atrial appendage.  2. Dilated ascending aorta (4.7cm) and aortic root (4.8cm).  3. 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  4. Moderately enlarged left atrium.  Exam End: 05/20/21 10:24  Specimen Collected: 05/20/21 10:44 Last Resulted: 06/01/21 15:26  Received From: [facility]  Result Received: 06/23/21 10:42       Impression:  This 71 year old gentleman has a stable 4.7 cm fusiform ascending aortic aneurysm.  This is well below the surgical threshold of 5.5 cm.  I reviewed the results with him by telephone and discussed the importance of continued good blood pressure control in preventing further enlargement and acute aortic dissection.  I have recommended doing a follow-up scan in 1 year.  Plan:  He will return to see me in 1 year with a MRA or CTA of the chest for follow-up of his aortic root and ascending aortic aneurysm.    I discussed limitations of evaluation and management via telephone.  The patient was advised to call back for repeat telephone  consultation or to seek an in-person evaluation if questions arise or the patient's clinical condition changes in any significant manner.  I spent 5 minutes of non-face-to-face time during the conduct of this telephone virtual office consultation, including pre-visit review of the patient's records and direct conversation with the patient.    Gaye Pollack, MD 06/24/2021 1:41 PM

## 2022-03-25 IMAGING — MR MR MRA CHEST W/ OR W/O CM
7 series · 16 of 16 positions shown · IV contrast (agent unspecified)
Comparison: 06/06/2019 and previous

CLINICAL DATA: Thoracic aortic aneurysm, follow-up

EXAM:
MRA CHEST   WITHOUT CONTRAST
TECHNIQUE: Angiographic images of the chest were obtained using MRA technique
without intravenous contrast.
CONTRAST:  None

[Series 3: bSSFP · axial · 5.0mm · 0.74mm/px · z∈[-159,+86]mm · 3 of 50 slices shown (1 of 3)]
[im 1/50]
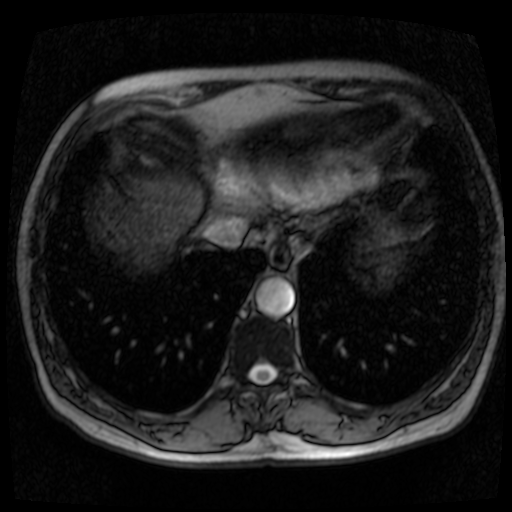
[im 25/50]
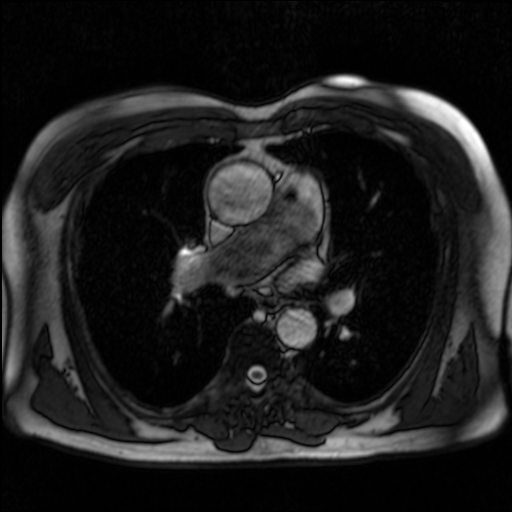
[im 50/50]
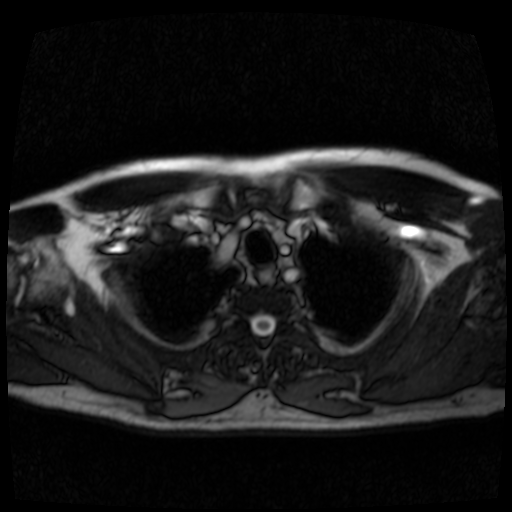

[Series 4: axial flash · axial · 5.0mm · 0.70mm/px · z∈[-147,+73]mm · 2 of 45 slices shown]
[im 1/45]
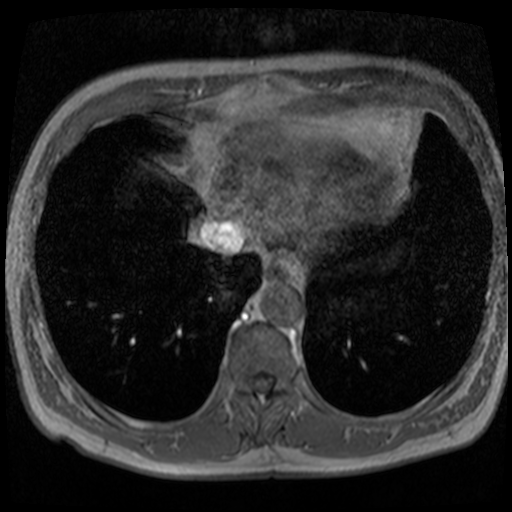
[im 45/45]
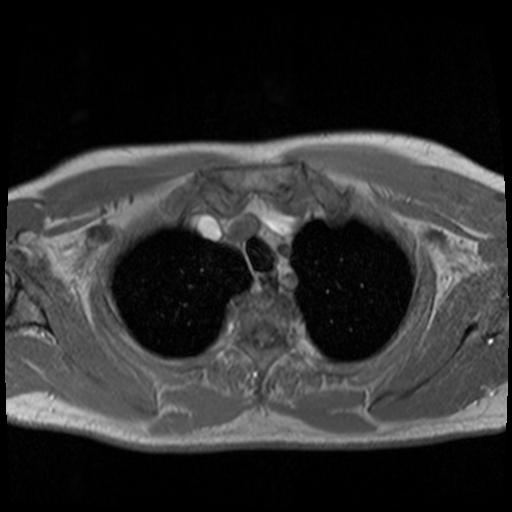

[Series 5: axial haste · axial · 5.0mm · 0.74mm/px · z∈[-147,+73]mm · 2 of 45 slices shown]
[im 1/45]
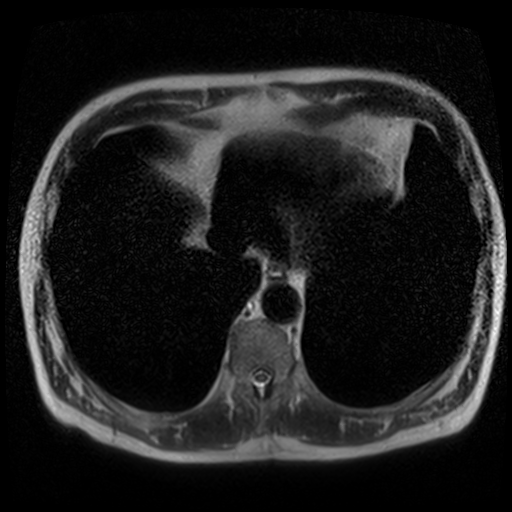
[im 45/45]
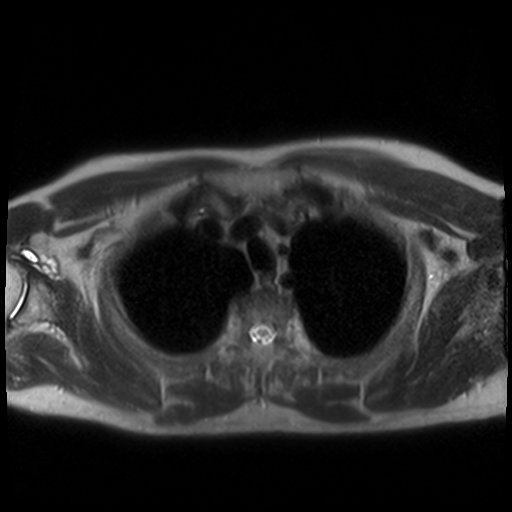

[Series 6: bSSFP · sagittal · 4.0mm · 0.68mm/px · 1 of 30 slices shown (2 of 3)]
[im 1/30]
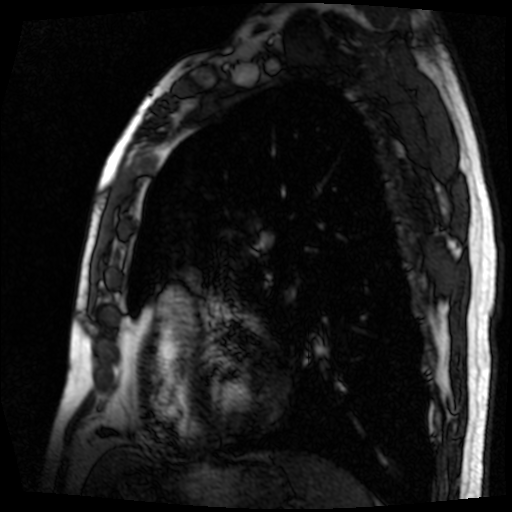

[Series 7: fl3d candy cane_tt=1.0s · sagittal · 1.5mm · 1.12mm/px · 3 of 80 slices shown]
[im 1/80]
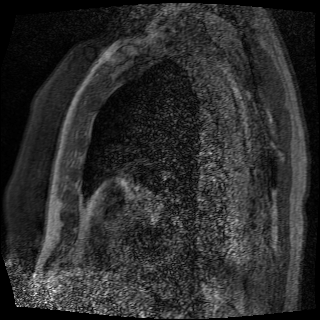
[im 40/80]
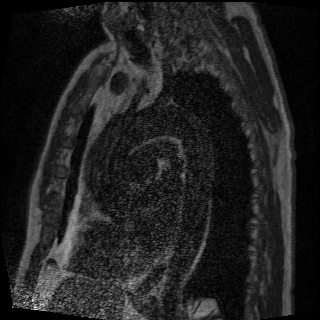
[im 80/80]
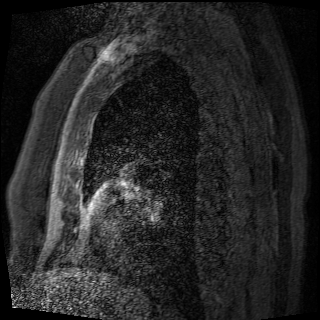

[Series 8: bSSFP · sagittal · 4.0mm · 0.68mm/px · 1 of 30 slices shown (3 of 3)]
[im 1/30]
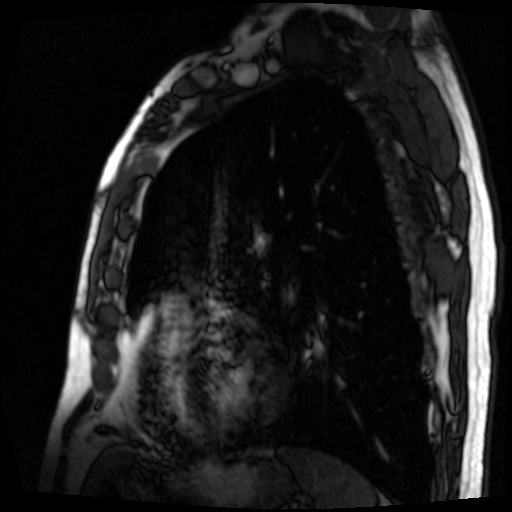

[Series 9: T1 dynamic · axial · non-contrast · 2.5mm · 0.78mm/px · z∈[-146,+72]mm · 4 of 88 slices shown]
[im 1/88]
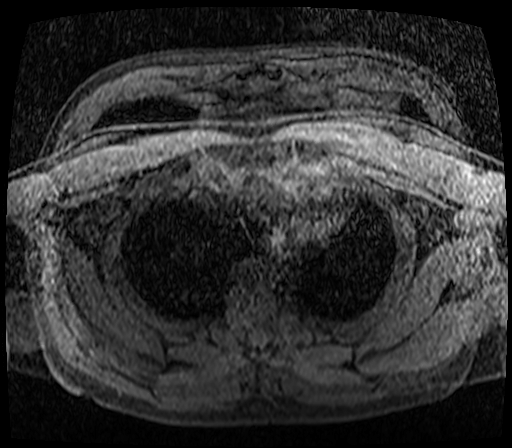
[im 30/88]
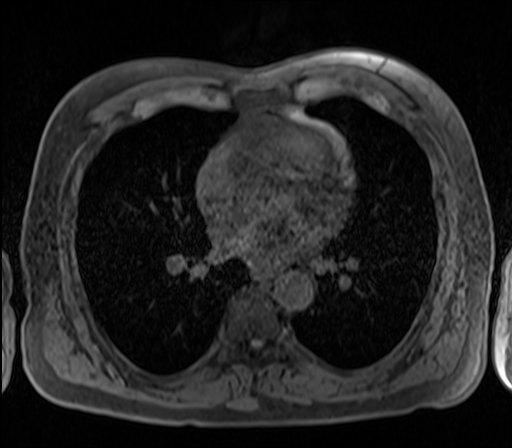
[im 59/88]
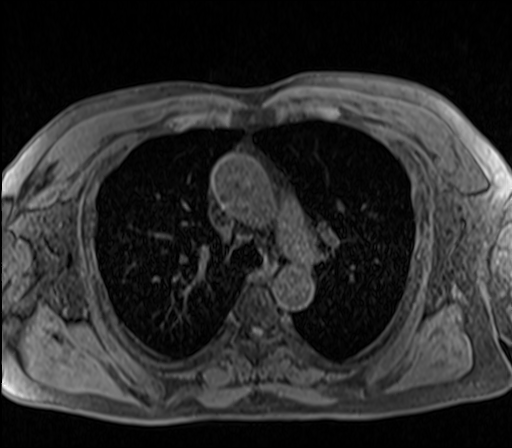
[im 88/88]
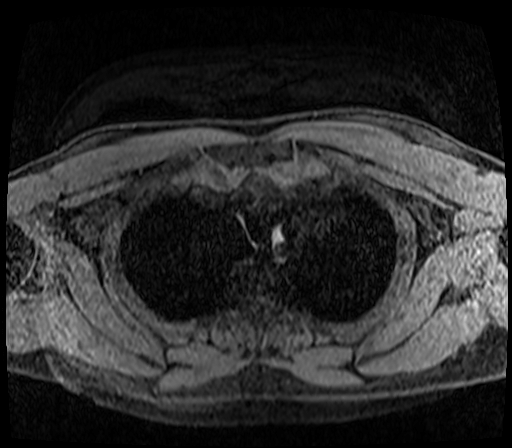

[16 of 16 positions shown; findings below may reference images not displayed]

FINDINGS: Cardiovascular: Heart size normal. No pericardial effusion. Dilated
central pulmonary arteries. The exam was not optimized for detection
of pulmonary emboli. No evidence of thoracic aortic dissection or
stenosis.

Aortic Root:

--Valve: 3.3 cm

--Sinuses: 4.3   cm

--Sinotubular Junction: 4.2 cm

Limitations by motion: Moderate

Thoracic Aorta:

--Ascending Aorta: 4.8 cm (stable compared to previous)

--Aortic Arch: 3.5 cm

--Descending Aorta:

Classic 3 vessel brachiocephalic arterial origin anatomy without
evident proximal stenosis.

Mediastinum/Nodes: No mass or adenopathy.

Lungs/Pleura: No pleural effusion.  No pulmonary mass identified.

Upper Abdomen: Negative limited evaluation

Musculoskeletal:
IMPRESSION: 1. Stable 4.8 cm ascending thoracic aortic aneurysm.
2. Dilated central pulmonary arteries suggesting pulmonary
hypertension.

## 2022-07-09 ENCOUNTER — Other Ambulatory Visit: Payer: Self-pay | Admitting: Surgery

## 2022-07-09 DIAGNOSIS — I7121 Aneurysm of the ascending aorta, without rupture: Secondary | ICD-10-CM

## 2022-07-12 ENCOUNTER — Other Ambulatory Visit: Payer: Self-pay | Admitting: Surgery

## 2022-07-12 DIAGNOSIS — I7121 Aneurysm of the ascending aorta, without rupture: Secondary | ICD-10-CM

## 2022-08-18 ENCOUNTER — Ambulatory Visit (INDEPENDENT_AMBULATORY_CARE_PROVIDER_SITE_OTHER): Payer: Medicare Other | Admitting: Surgery

## 2022-08-18 ENCOUNTER — Other Ambulatory Visit: Payer: Medicare Other

## 2022-08-18 ENCOUNTER — Encounter: Payer: Self-pay | Admitting: Surgery

## 2022-08-18 DIAGNOSIS — I7121 Aneurysm of the ascending aorta, without rupture: Secondary | ICD-10-CM

## 2022-08-18 NOTE — Progress Notes (Signed)
[address]       [place] [address]             [place] VIRTUAL OFFICE NOTE  Referring Provider is Troy Sine, MD Primary Cardiologist is None PCP is Patient, No Pcp Per   HPI:  I spoke with [name] Ayala (DOB August 31, 1950 ) via telephone on 08/18/2022 at 3:39 PM and verified that I was speaking with the correct person using more than one form of identification.  We discussed the fact that I was contacting them from my office and they were located at home, as well as the reason(s) for conducting our visit virtually instead of in-person.  The patient expressed understanding the circumstances and agreed to proceed as described.   The patient is a 72 year old gentleman who I have been following with a 4.6 to 4.8 cm fusiform ascending aortic aneurysm that has been stable since it was first diagnosed in 2009.  I last did a telephone visit with him on 06/24/2021 and a CTA of the heart from 05/20/2021 at [facility] showed a 4.7 cm ascending aorta and a 4.8 cm aortic root.  This study was done in preparation for an atrial flutter ablation done at [facility] on 05/27/2021.  The patient currently lives in [place].  The patient said that he was in [employer] earlier this month and returned home and started working out but had an episode of chest discomfort that was new.  This recurred and he sought medical attention at [facility] in [place].  His workup included a CTA of the chest to rule out aortic dissection.  He sent me a copy of the disc which I have personally reviewed and the diameter of the ascending aorta was 4.8 cm with no evidence of aortic dissection.  He has had no further episodes of chest discomfort.  He was diagnosed with COVID and it was felt that that may have caused his symptoms of chest discomfort.  He monitors his blood pressure at home every 2 weeks and it has been under good control.     Current  Outpatient Medications  Medication Sig Dispense Refill   atorvastatin (LIPITOR) 40 MG tablet Take 40 mg by mouth daily.      diltiazem (CARDIZEM CD) 180 MG 24 hr capsule Take 180 mg by mouth daily.      ezetimibe (ZETIA) 10 MG tablet Take 10 mg by mouth daily.     flecainide (TAMBOCOR) 50 MG tablet Take 50 mg by mouth 2 (two) times daily.      losartan (COZAAR) 50 MG tablet Take 50 mg by mouth.     SYNTHROID 200 MCG tablet Take 200 mcg by mouth daily.      No current facility-administered medications for this visit.     Diagnostic Tests:  CTA chest done at [facility] in [place] on 08/02/2022 was personally reviewed by me on disc.  Ascending aortic diameter was 4.8 cm.   Impression:  He has a stable 4.8 cm fusiform ascending aortic aneurysm.  This is well below the surgical threshold of 5.5 cm.  I reviewed the results of the CTA with him and answered his questions.  I stressed the importance of continued good blood pressure control in preventing further enlargement and acute aortic dissection.  He understands that he should avoid heavy lifting that requires a Valsalva maneuver and could suddenly raise his blood pressure to high  levels.  Plan:  I will see him back in 1 year with an MRA of the chest which he prefers for aortic surveillance.    I discussed limitations of evaluation and management via telephone.  The patient was advised to call back for repeat telephone consultation or to seek an in-person evaluation if questions arise or the patient's clinical condition changes in any significant manner.  I spent 10 minutes of non-face-to-face time during the conduct of this telephone virtual office consultation, including pre-visit review of the patient's records and direct conversation with the patient.     Gaye Pollack, MD 08/18/2022 3:39 PM

## 2023-06-29 ENCOUNTER — Other Ambulatory Visit: Payer: Self-pay | Admitting: Surgery

## 2023-06-29 DIAGNOSIS — I7121 Aneurysm of the ascending aorta, without rupture: Secondary | ICD-10-CM

## 2023-07-04 ENCOUNTER — Other Ambulatory Visit: Payer: Self-pay | Admitting: Surgery

## 2023-07-04 DIAGNOSIS — I7121 Aneurysm of the ascending aorta, without rupture: Secondary | ICD-10-CM

## 2023-08-24 ENCOUNTER — Ambulatory Visit
Admission: RE | Admit: 2023-08-24 | Discharge: 2023-08-24 | Disposition: A | Discharge status: Home / Self Care | Payer: Medicare Other | Source: Ambulatory Visit | Attending: Surgery | Admitting: Surgery

## 2023-08-24 ENCOUNTER — Encounter: Payer: Self-pay | Admitting: Surgery

## 2023-08-24 ENCOUNTER — Ambulatory Visit (INDEPENDENT_AMBULATORY_CARE_PROVIDER_SITE_OTHER): Payer: Medicare Other | Admitting: Surgery

## 2023-08-24 VITALS — BP 160/89 | HR 81 | Resp 18 | Ht 73.0 in | Wt 222.0 lb

## 2023-08-24 DIAGNOSIS — I7121 Aneurysm of the ascending aorta, without rupture: Secondary | ICD-10-CM

## 2023-08-24 NOTE — Progress Notes (Signed)
 HPI:  The patient has an active 73 year old gentleman with hypertension and atrial flutter status post ablation who returns for follow-up of a 4.8 cm fusiform ascending aortic aneurysm.  He feels well overall.  He recently had 2 brief episodes of atrial flutter that quickly self terminated.  He denies any chest pain or pressure.  Denies any shortness of breath.  Current Outpatient Medications  Medication Sig Dispense Refill   atorvastatin (LIPITOR) 40 MG tablet Take 40 mg by mouth daily.      diltiazem (CARDIZEM CD) 180 MG 24 hr capsule Take 225 mg by mouth daily.     ezetimibe (ZETIA) 10 MG tablet Take 10 mg by mouth daily.     irbesartan (AVAPRO) 300 MG tablet Take by mouth.     SYNTHROID 200 MCG tablet Take 200 mcg by mouth daily.      No current facility-administered medications for this visit.     Physical Exam: BP (!) 160/89 (BP Location: Right Arm)   Pulse 81   Resp 18   Ht 6\' 1"  (1.854 m)   Wt 222 lb (100.7 kg)   SpO2 96%   BMI 29.29 kg/m  He looks well. Cardiac exam shows a regular rate and rhythm with normal heart sounds.  There is no murmur. Lungs are clear.   Diagnostic Tests:  Narrative & Impression  CLINICAL DATA:  History of ascending thoracic aortic aneurysm   EXAM: MRA CHEST  r WITHOUT CONTRAST   TECHNIQUE: Angiographic images of the chest were obtained using MRA technique without intravenous contrast.   CONTRAST:  None required   COMPARISON:  06/04/2020 and previous   FINDINGS: Cardiovascular: Heart size normal. No pericardial effusion. Central pulmonary arteries grossly unremarkable.   Aortic Root:   --Valve: 2.8 cm   --Sinuses: 4.2 cm   --Sinotubular Junction: 4.1 cm   Limitations by motion: Mild   Thoracic Aorta:   --Ascending Aorta: 4.7 cm (previously 4.8)   --Aortic Arch: 3.7 cm   --Descending Aorta: 3.1 cm   Mediastinum/Nodes: No mass or adenopathy.   Lungs/Pleura: No pleural effusion. No pulmonary mass or  acute finding.   Upper Abdomen: Negative limited evaluation   Musculoskeletal: Negative limited evaluation.   Spinal cord: Visualized segments are unremarkable.   IMPRESSION: 4.7 cm ascending thoracic aortic aneurysm, previously 4.8 cm.   Recommend semi-annual imaging followup by CTA or MRA and referral to cardiothoracic surgery if not already obtained. This recommendation follows 2010 ACCF/AHA/AATS/ACR/ASA/SCA/SCAI/SIR/STS/SVM Guidelines for the Diagnosis and Management of Patients With Thoracic Aortic Disease. Circulation. 2010; 121: Z610-R604     Electronically Signed   By: Corlis Leak M.D.   On: 08/24/2023 14:15      Impression:  He has a stable 4.7-4.8 cm fusiform ascending aortic aneurysm which has been stable since 2009. His aneurysm is still well below the surgical threshold of 5.5 cm.  I reviewed the MRA images with him and answered all of his questions.  I stressed the importance of continued good blood pressure control in preventing further enlargement and acute aortic dissection.  I advised him against doing any heavy lifting that may require a Valsalva maneuver and could suddenly raise his blood pressure to high levels.   Plan:  His aneurysm has been stable for 15 years.  I will plan to see him back in 2 years with an MRA of the chest for aortic surveillance.  I spent 10 minutes performing this established patient evaluation and > 50% of this time was  spent face to face counseling and coordinating the care of this patient's aortic aneurysm.    Alleen Borne, MD [facility] [phone]
